# Patient Record
Sex: Male | Born: 1935 | Race: White | Hispanic: No
Health system: Northeastern US, Academic
[De-identification: ages and names within clinical notes are randomized; demographics above are authoritative.]

---

## 2021-05-08 IMAGING — MR MRI LUMBAR SPINE WITHOUT CONTRAST
4 of 7 series · 19 of 48 positions shown · IV contrast (gadolinium)
Comparison: None

FINAL Diagnostic Imaging Report 
________________________________________________________________________________________________ 
MRI LUMBAR SPINE WITHOUT CONTRAST, 05/08/2021 [DATE]: 
CLINICAL INDICATION: SM9.MD LOW BACK PAIN
TECHNIQUE: Multiplanar, multiecho position MR images of the lumbar spine were 
performed without intravenous gadolinium enhancement. Patient was scanned on a 
1.5T magnet.

[Series 2: T2 · coronal · 5.0mm · 0.55mm/px · 5 of 15 slices shown (1 of 3)]
[im 1/15]
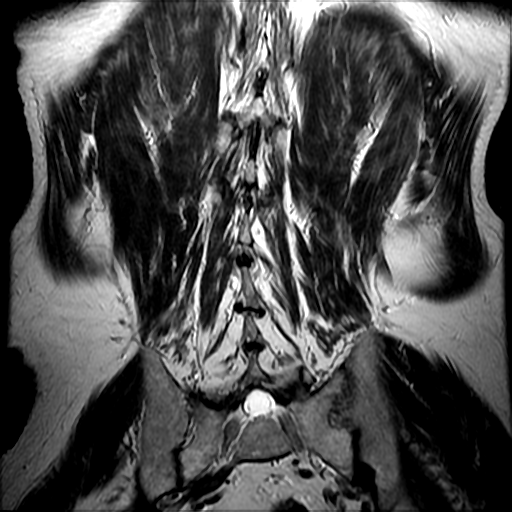
[im 4/15]
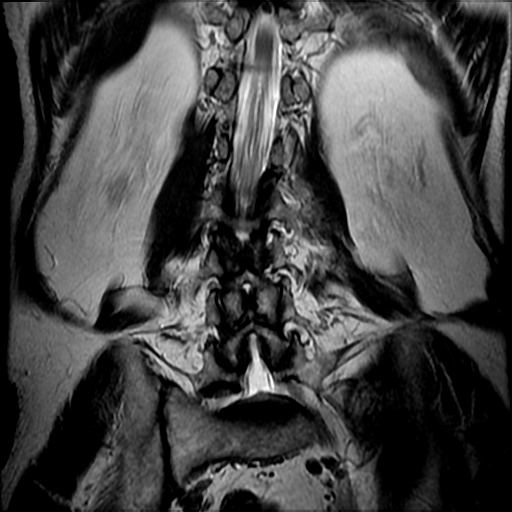
[im 8/15]
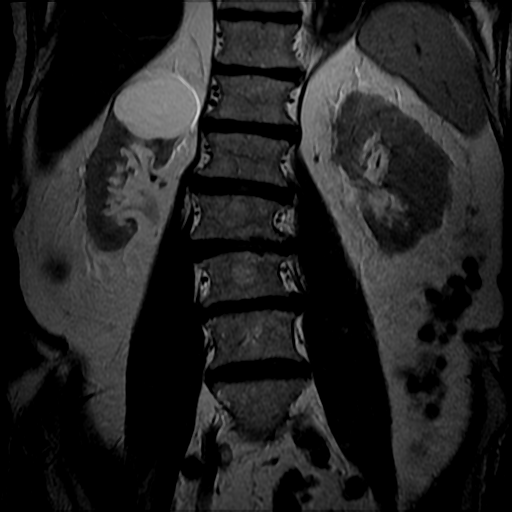
[im 11/15]
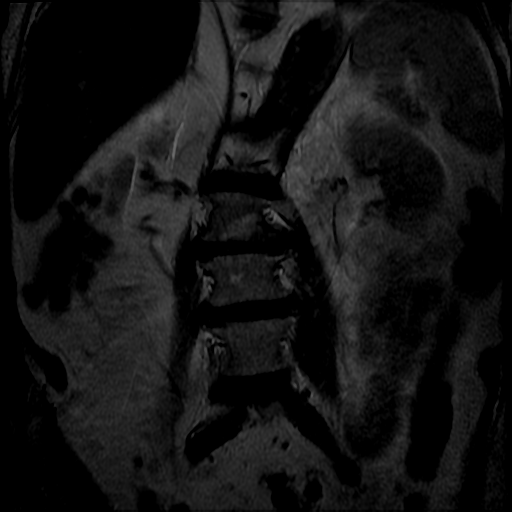
[im 15/15]
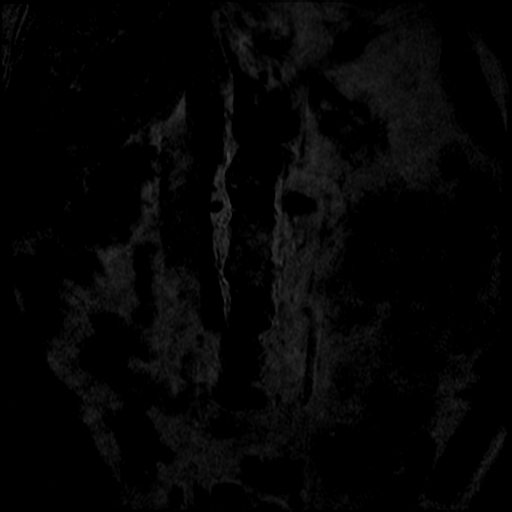

[Series 3: T1 · sagittal · 4.0mm · 0.51mm/px · 3 of 17 slices shown]
[im 4/17]
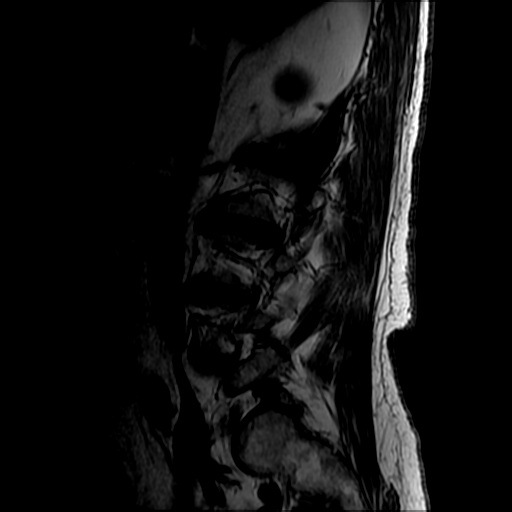
[im 10/17]
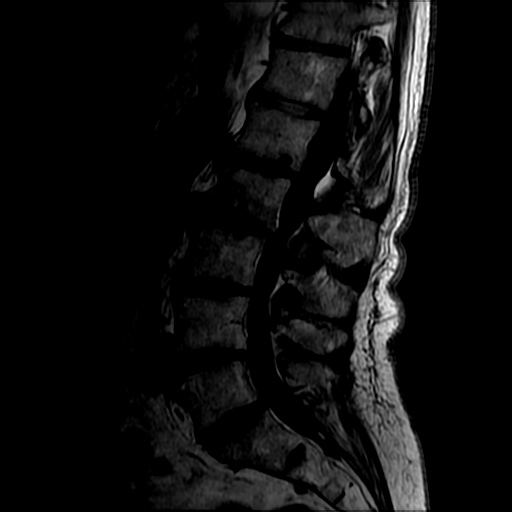
[im 17/17]
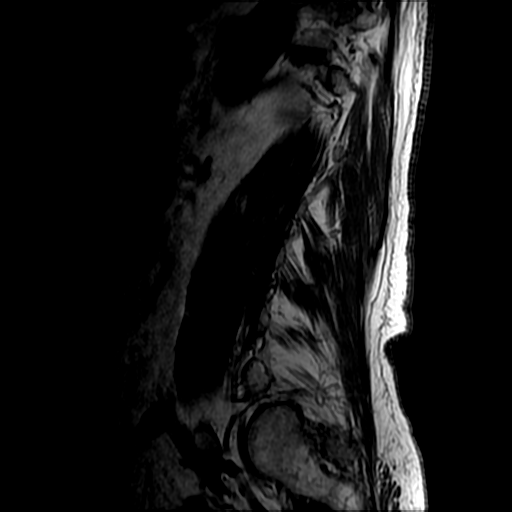

[Series 4: T2 · sagittal · 4.0mm · 0.51mm/px · 6 of 17 slices shown (2 of 3)]
[im 1/17]
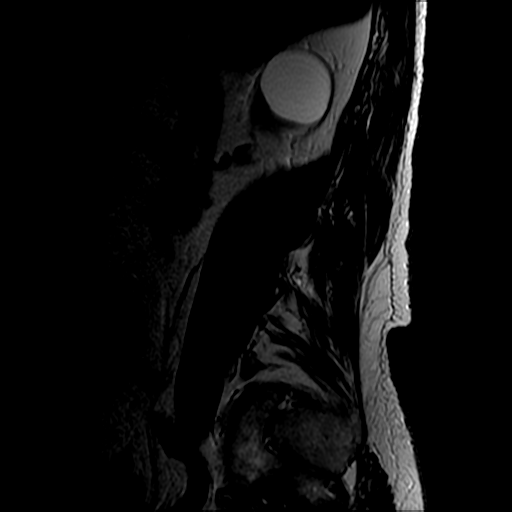
[im 4/17]
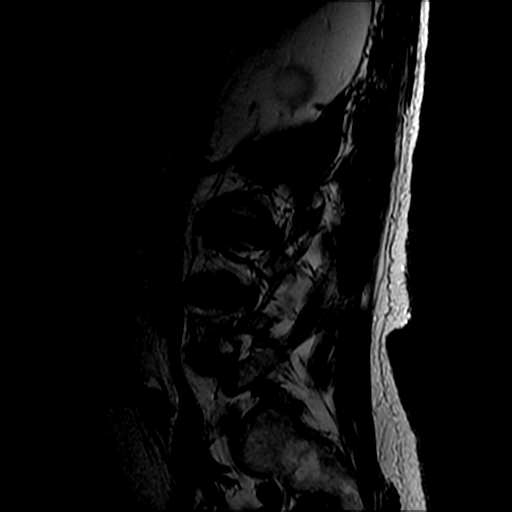
[im 7/17]
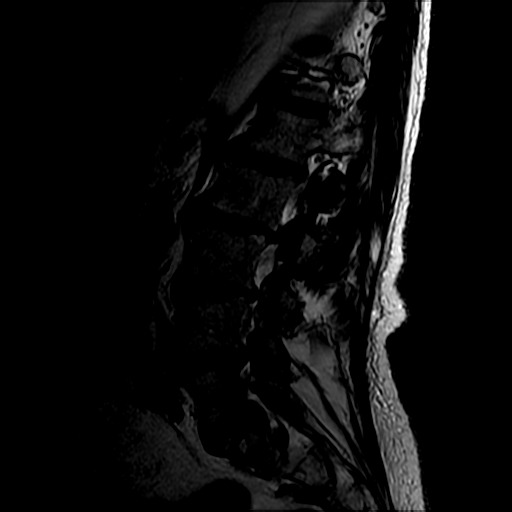
[im 10/17]
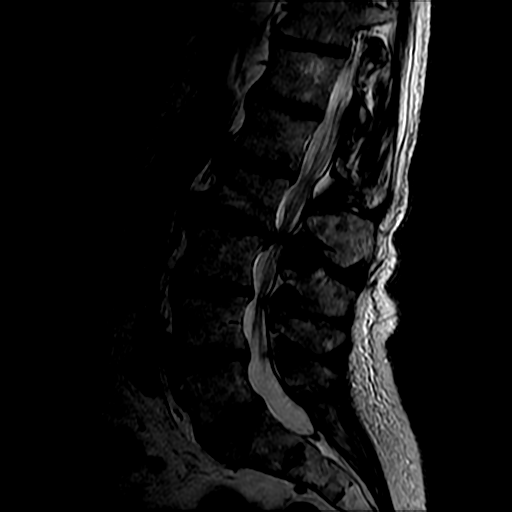
[im 13/17]
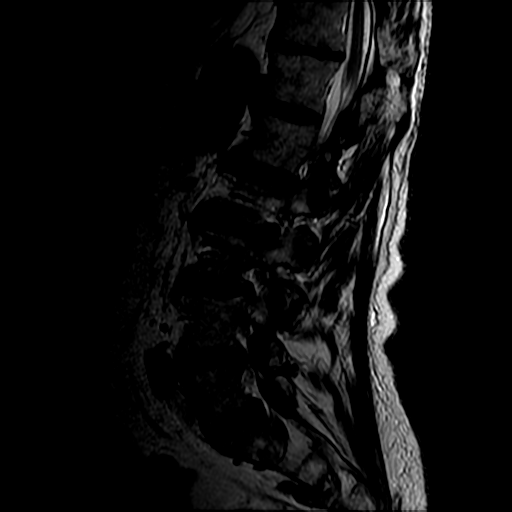
[im 17/17]
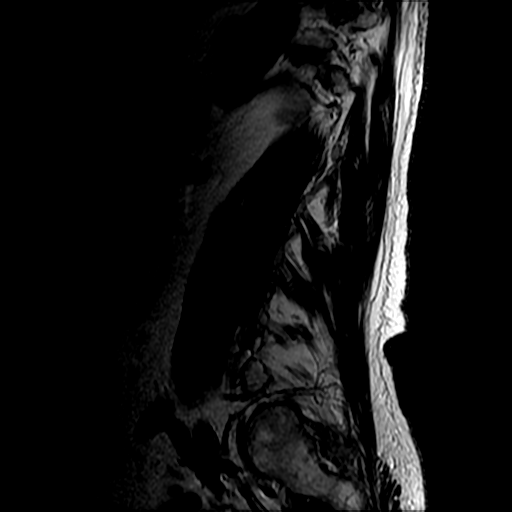

[Series 6: T2 · axial · 4.0mm · 0.35mm/px · z∈[-133,+97]mm · 5 of 30 slices shown (3 of 3)]
[im 1/30]
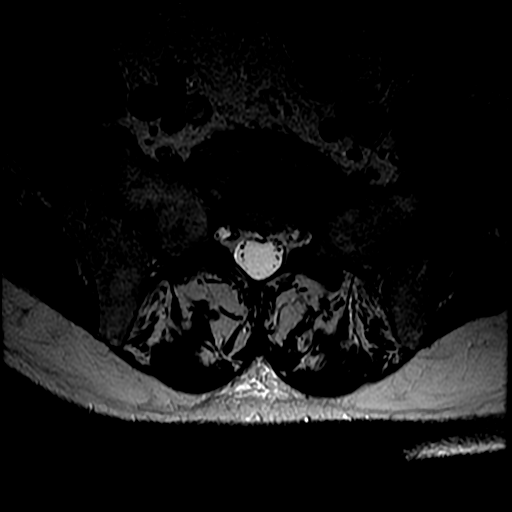
[im 4/30]
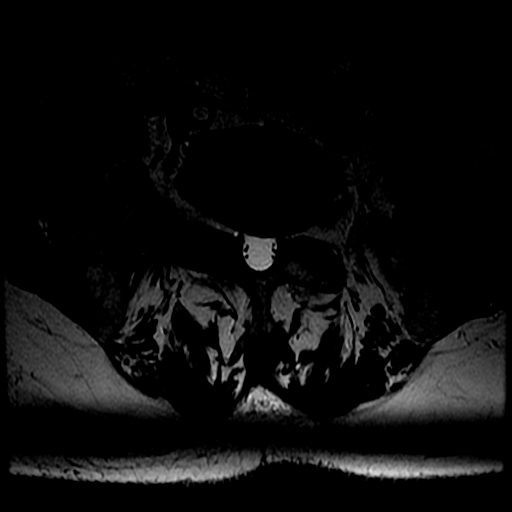
[im 10/30]
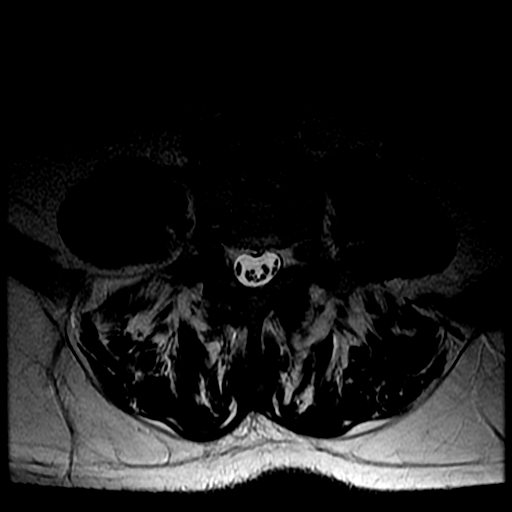
[im 17/30]
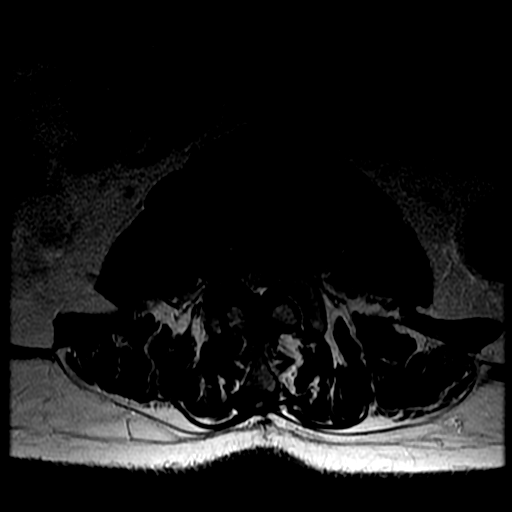
[im 26/30]
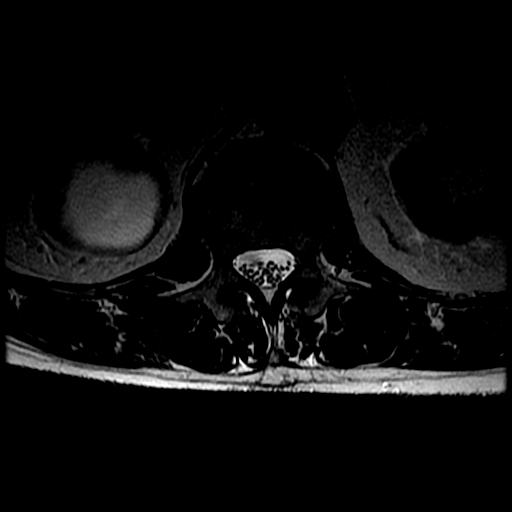

[19 of 48 positions shown; findings below may reference images not displayed]

FINDINGS: Lumbar vertebral heights are intact. There is no compression fracture. 
The lower cord and conus show normal signal, conus terminating at T12-L1. There 
is a small hemangioma at T12. There is a benign cyst in the upper right kidney. 
There is mild upper lumbar dextroscoliosis. There is marked disc narrowing at 
L2-3 to the left of midline. There is moderate narrowing elsewhere, including 
the posterior L3-4 and L4-5 interspaces. There is reactive edema along the left 
L2-3 interspace. 
There is disc-osteophyte on the left at L2-3, contributing to moderate to marked 
canal stenosis. There is encroachment on the exiting L3 nerve roots, worse on 
the left, best seen on axial T2 images 14/15. There is moderate left foraminal 
stenosis. 
At L3-4 and L4-5 there is mild canal stenosis. At L5-S1 the canal is open. At 
L1-2 there is mild canal stenosis. At T12-L1 the canal is open.
IMPRESSION: Multilevel degenerative changes are most pronounced at L2-3. There is moderate 
to marked canal stenosis at this level with left posterolateral disc-osteophyte 
impinging the left L3 nerve root. There is mild canal stenosis at L3-4 and L4-5. 
Reactive edema/Modic type I changes on the left at L2-3. There is mild upper 
lumbar dextroscoliosis. 
No evidence for compression fracture or malignancy.

## 2021-09-15 IMAGING — MR MRI TMJ WITHOUT CONTRAST
9 series · 48 of 48 positions shown · IV contrast (gadolinium)
Comparison: Brain MR September 19, 2010.

________________________________________________________________________________________________ 
MRI TMJ WITHOUT CONTRAST, 09/15/2021 [DATE]: 
CLINICAL INDICATION: 87-year-old male developed pain in the right side of the 
jaw up into the maxillary area after several temporal Crohns place. 
Temporomandibular joint dysfunction syndrome with pain.
TECHNIQUE: Multiplanar, multiecho position MR images of the temporomandibular 
joints were performed without intravenous gadolinium enhancement. Patient was 
scanned on a 3T magnet.

[Series 101: survey · axial · 10.0mm · 0.98mm/px · z∈[+20,+142]mm · 3 of 7 slices shown]
[im 1/7]
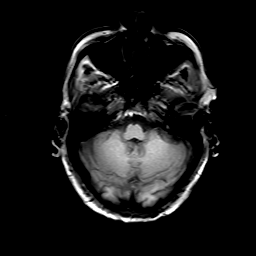
[im 4/7]
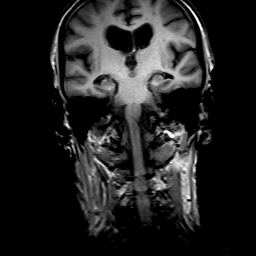
[im 7/7]
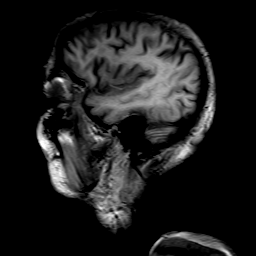

[Series 201: T1 · axial · 4.0mm · 0.70mm/px · z∈[-34,+58]mm · 6 of 22 slices shown]
[im 1/22]
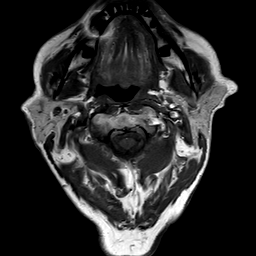
[im 5/22]
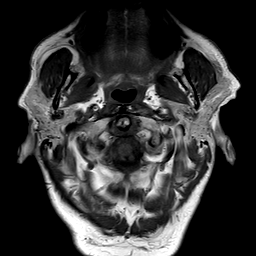
[im 9/22]
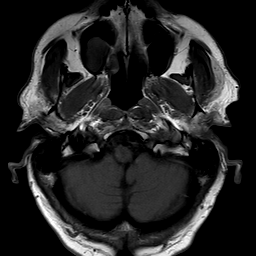
[im 13/22]
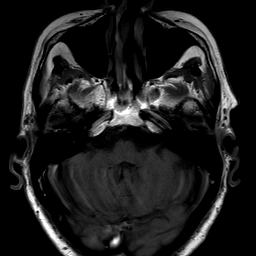
[im 17/22]
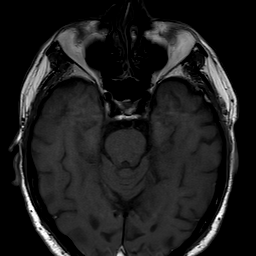
[im 22/22]
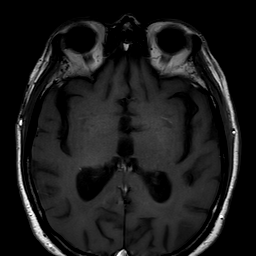

[Series 401: pdw_tse cor · coronal · 3.0mm · 0.59mm/px · 3 of 14 slices shown]
[im 1/14]
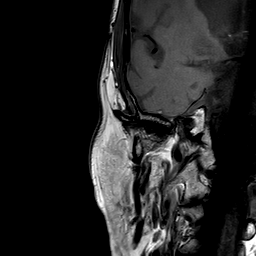
[im 7/14]
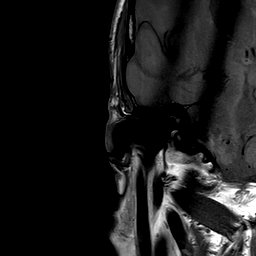
[im 14/14]
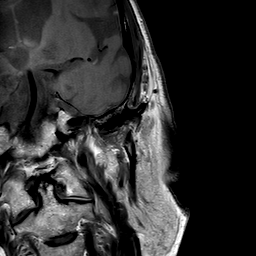

[Series 501: T2 · coronal · 3.0mm · 0.47mm/px · 3 of 14 slices shown]
[im 1/14]
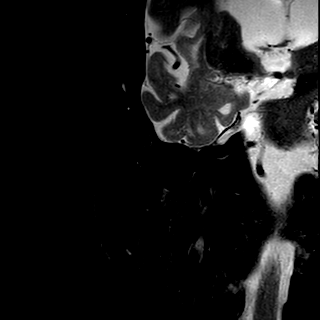
[im 7/14]
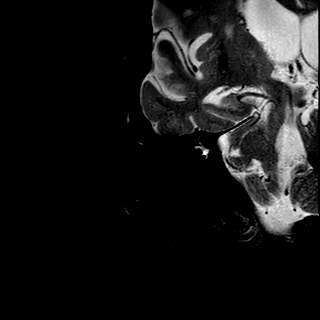
[im 14/14]
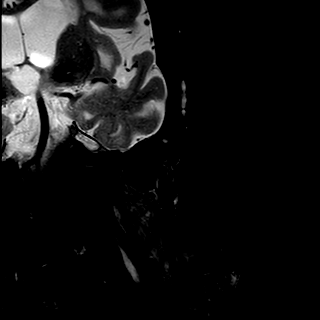

[Series 601: pdsag-closed-bil · sagittal · 2.0mm · 0.52mm/px · 6 of 28 slices shown]
[im 1/28]
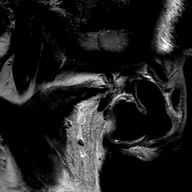
[im 6/28]
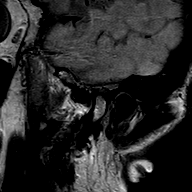
[im 11/28]
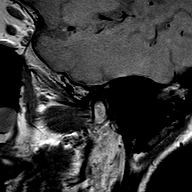
[im 17/28]
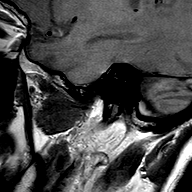
[im 22/28]
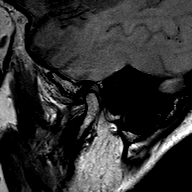
[im 28/28]
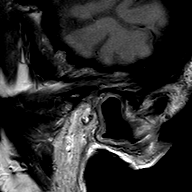

[Series 701: PD · sagittal · 2.0mm · 0.39mm/px · 6 of 28 slices shown]
[im 1/28]
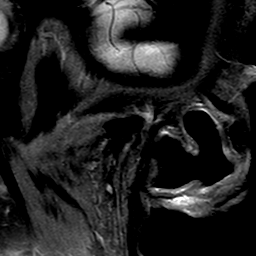
[im 6/28]
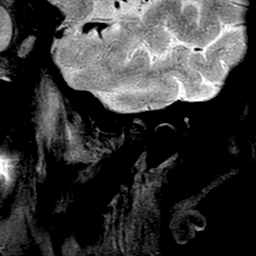
[im 11/28]
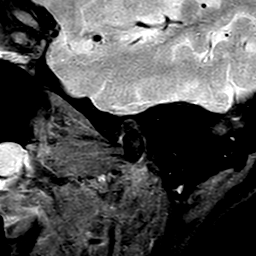
[im 17/28]
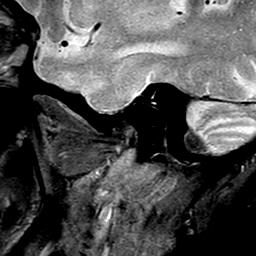
[im 22/28]
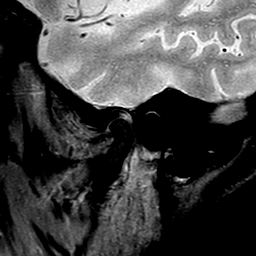
[im 28/28]
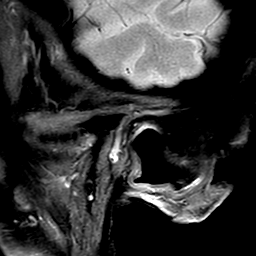

[Series 801: t2w_tse sag · sagittal · 2.0mm · 0.39mm/px · 6 of 28 slices shown]
[im 1/28]
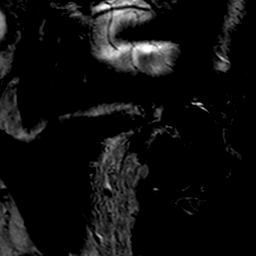
[im 6/28]
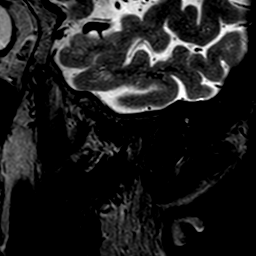
[im 11/28]
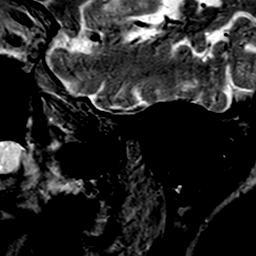
[im 17/28]
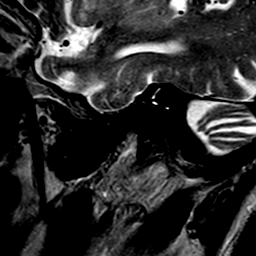
[im 22/28]
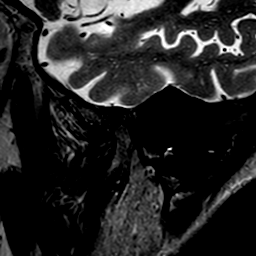
[im 28/28]
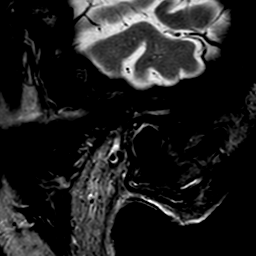

[Series 901: pdsag-open-bil · sagittal · 2.0mm · 0.52mm/px · 6 of 28 slices shown]
[im 1/28]
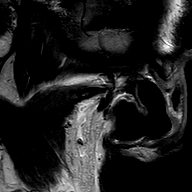
[im 6/28]
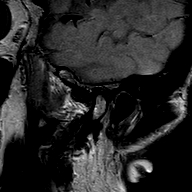
[im 11/28]
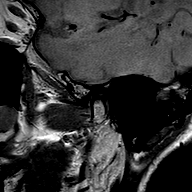
[im 17/28]
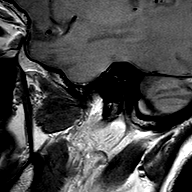
[im 22/28]
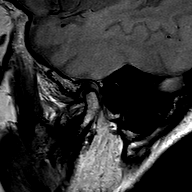
[im 28/28]
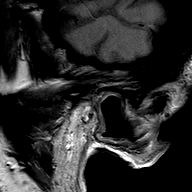

[Series 1001: movie · sagittal · 3.5mm · 0.62mm/px · 9 of 40 slices shown]
[im 1/40]
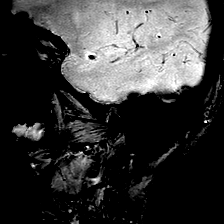
[im 5/40]
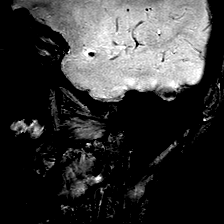
[im 10/40]
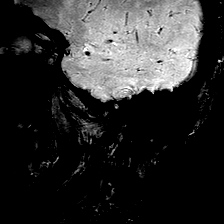
[im 15/40]
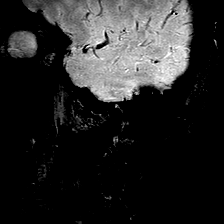
[im 20/40]
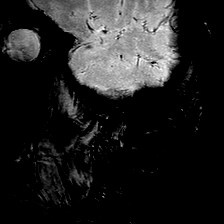
[im 25/40]
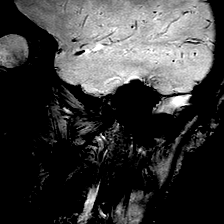
[im 30/40]
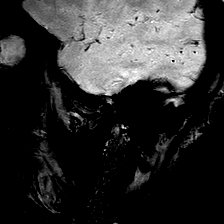
[im 35/40]
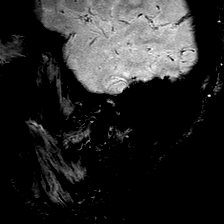
[im 40/40]
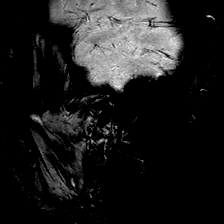

[48 of 48 positions shown; findings below may reference images not displayed]

FINDINGS: The exam is somewhat limited due to the patients relative inability to open his 
mouth. 
Surveillance views demonstrate a moderate-sized inferior right maxillary mucous 
retention cyst. Other sinuses are well pneumatized. Nasal septum deviates to the 
right but appears intact. Visualized intracranial contents are unremarkable. 
Bilateral pseudophakia. Other sinuses are well pneumatized. There is scattered 
minimal fluid within several right mastoid air cells. 
 Right temporomandibular joint: No evidence of a joint effusion. No significant 
degenerative change involving the right mandibular condylar head. The articular 
disc is appropriately located there is no evidence of disc dislocation. There is 
minimal translation of the mandibular condylar head in the open-mouth position. 
No evidence of disc displacement. Given the relative lack of translation of the 
mandibular condylar head in the open mouth position, a stuck disc would be 
excluded. 
Left temporomandibular joint: No significant degenerative changes involving the 
left mandibular condylar head. No joint effusion. No evidence of disc 
displacement. There is very little translation of the condylar head in the 
open-mouth position. Given the relative lack of translation of the mandibular 
condylar head in the open-mouth position, a stuck disc would be difficult to 
exclude.
IMPRESSION: No evidence of disc displacement. No joint effusion. Very little translation of 
the condylar head in the open-mouth position, and therefore a stuck disc 
bilaterally would be difficult to exclude.  
Right maxillary mucous retention cyst.

## 2022-08-21 ENCOUNTER — Inpatient Hospital Stay: Admit: 2022-08-21 | Discharge: 2022-08-28 | Payer: MEDICARE | Attending: Vascular Surgery | Admitting: Vascular Surgery

## 2022-08-21 ENCOUNTER — Emergency Department: Admit: 2022-08-21 | Payer: MEDICARE | Attending: Diagnostic Radiology

## 2022-08-21 ENCOUNTER — Emergency Department: Admit: 2022-08-21 | Payer: MEDICARE

## 2022-08-21 ENCOUNTER — Encounter: Admit: 2022-08-21 | Payer: PRIVATE HEALTH INSURANCE

## 2022-08-21 ENCOUNTER — Inpatient Hospital Stay: Admit: 2022-08-21 | Discharge: 2022-08-21 | Payer: MEDICARE | Attending: Emergency Medicine

## 2022-08-21 DIAGNOSIS — R059 Cough, unspecified: Secondary | ICD-10-CM

## 2022-08-21 DIAGNOSIS — R197 Diarrhea, unspecified: Secondary | ICD-10-CM

## 2022-08-21 DIAGNOSIS — Z79899 Other long term (current) drug therapy: Secondary | ICD-10-CM

## 2022-08-21 DIAGNOSIS — Z7982 Long term (current) use of aspirin: Secondary | ICD-10-CM

## 2022-08-21 DIAGNOSIS — R109 Unspecified abdominal pain: Secondary | ICD-10-CM

## 2022-08-21 LAB — COMPREHENSIVE METABOLIC PANEL
BKR A/G RATIO: 1.2 (ref 1.0–2.2)
BKR ALANINE AMINOTRANSFERASE (ALT): 22 U/L (ref 14–63)
BKR ALBUMIN: 4.2 g/dL (ref 3.4–5.0)
BKR ALKALINE PHOSPHATASE: 81 U/L (ref 46–116)
BKR ANION GAP: 10 (ref 5–16)
BKR ASPARTATE AMINOTRANSFERASE (AST): 21 U/L (ref 10–42)
BKR AST/ALT RATIO: 1
BKR BILIRUBIN TOTAL: 0.5 mg/dL (ref 0.2–1.2)
BKR BLOOD UREA NITROGEN: 27 mg/dL — ABNORMAL HIGH (ref 7–18)
BKR BUN / CREAT RATIO: 14.2 (ref 8.0–23.0)
BKR CALCIUM: 9.5 mg/dL (ref 8.5–10.5)
BKR CHLORIDE: 104 mmol/L (ref 98–107)
BKR CO2: 26 mmol/L (ref 21–32)
BKR CREATININE: 1.9 mg/dL — ABNORMAL HIGH (ref 0.80–1.30)
BKR EGFR, CREATININE (CKD-EPI 2021): 34 mL/min/{1.73_m2} — ABNORMAL LOW (ref >=60)
BKR GLOBULIN: 3.4 g/dL (ref 2.3–3.5)
BKR GLUCOSE: 83 mg/dL (ref 70–100)
BKR POTASSIUM: 4.4 mmol/L (ref 3.5–5.1)
BKR PROTEIN TOTAL: 7.6 g/dL (ref 6.0–8.0)
BKR SODIUM: 140 mmol/L (ref 136–145)

## 2022-08-21 LAB — BASIC METABOLIC PANEL
BKR ANION GAP: 11 (ref 7–17)
BKR BLOOD UREA NITROGEN: 21 mg/dL (ref 8–23)
BKR BUN / CREAT RATIO: 12.5 (ref 8.0–23.0)
BKR CALCIUM: 8.7 mg/dL — ABNORMAL LOW (ref 8.8–10.2)
BKR CHLORIDE: 111 mmol/L — ABNORMAL HIGH (ref 98–107)
BKR CO2: 22 mmol/L (ref 20–30)
BKR CREATININE: 1.68 mg/dL — ABNORMAL HIGH (ref 0.40–1.30)
BKR EGFR, CREATININE (CKD-EPI 2021): 39 mL/min/{1.73_m2} — ABNORMAL LOW (ref >=60)
BKR GLUCOSE: 82 mg/dL (ref 70–100)
BKR POTASSIUM: 4.2 mmol/L (ref 3.3–5.3)
BKR SODIUM: 144 mmol/L (ref 136–144)
BKR WAM HEMOGLOBIN: 11 g/dL — ABNORMAL LOW (ref 7–17)

## 2022-08-21 LAB — CBC WITH AUTO DIFFERENTIAL
BKR WAM ABSOLUTE IMMATURE GRANULOCYTES.: 0.05 x 1000/ÂµL (ref 0.00–0.30)
BKR WAM ABSOLUTE IMMATURE GRANULOCYTES.: 0.03 x 1000/ÂµL (ref 0.00–0.30)
BKR WAM ABSOLUTE LYMPHOCYTE COUNT.: 1.65 x 1000/ÂµL (ref 0.60–3.70)
BKR WAM ABSOLUTE LYMPHOCYTE COUNT.: 1.78 x 1000/??L (ref 0.60–3.70)
BKR WAM ABSOLUTE NRBC (2 DEC): 0 x 1000/ÂµL (ref 0.00–1.00)
BKR WAM ABSOLUTE NRBC (2 DEC): 0 x 1000/??L (ref 0.00–1.00)
BKR WAM ANALYZER ANC: 4.82 x 1000/ÂµL (ref 2.00–7.60)
BKR WAM ANALYZER ANC: 4.14 x 1000/ÂµL (ref 2.00–7.60)
BKR WAM BASOPHIL ABSOLUTE COUNT.: 0.08 x 1000/ÂµL (ref 0.00–1.00)
BKR WAM BASOPHIL ABSOLUTE COUNT.: 0.07 x 1000/ÂµL (ref 0.00–1.00)
BKR WAM BASOPHILS: 1.1 % (ref 0.0–1.4)
BKR WAM BASOPHILS: 1 % (ref 0.0–1.4)
BKR WAM EOSINOPHIL ABSOLUTE COUNT.: 0.24 x 1000/ÂµL (ref 0.00–1.00)
BKR WAM EOSINOPHIL ABSOLUTE COUNT.: 0.23 x 1000/ÂµL (ref 0.00–1.00)
BKR WAM EOSINOPHILS: 3.2 % (ref 0.0–5.0)
BKR WAM EOSINOPHILS: 3.4 % (ref 0.0–5.0)
BKR WAM HEMATOCRIT (2 DEC): 46 % (ref 38.50–50.00)
BKR WAM HEMATOCRIT (2 DEC): 37.5 % — ABNORMAL LOW (ref 38.50–50.00)
BKR WAM HEMOGLOBIN: 15.1 g/dL (ref 13.2–17.1)
BKR WAM IMMATURE GRANULOCYTES: 0.7 % (ref 0.0–1.0)
BKR WAM IMMATURE GRANULOCYTES: 0.4 % (ref 0.0–1.0)
BKR WAM LYMPHOCYTES: 22 % (ref 17.0–50.0)
BKR WAM LYMPHOCYTES: 26.3 % (ref 17.0–50.0)
BKR WAM MCH (PG): 34.1 pg — ABNORMAL HIGH (ref 27.0–33.0)
BKR WAM MCH (PG): 34.3 pg — ABNORMAL HIGH (ref 27.0–33.0)
BKR WAM MCHC: 32.8 g/dL (ref 31.0–36.0)
BKR WAM MCHC: 32.8 g/dL (ref 31.0–36.0)
BKR WAM MCV: 103.8 fL — ABNORMAL HIGH (ref 80.0–100.0)
BKR WAM MCV: 104.5 fL — ABNORMAL HIGH (ref 80.0–100.0)
BKR WAM MONOCYTE ABSOLUTE COUNT.: 0.65 x 1000/ÂµL (ref 0.00–1.00)
BKR WAM MONOCYTE ABSOLUTE COUNT.: 0.52 x 1000/ÂµL (ref 0.00–1.00)
BKR WAM MONOCYTES: 8.7 % (ref 4.0–12.0)
BKR WAM MONOCYTES: 7.7 % (ref 4.0–12.0)
BKR WAM MPV: 9.9 fL (ref 8.0–12.0)
BKR WAM MPV: 9.9 fL (ref 8.0–12.0)
BKR WAM NEUTROPHILS: 64.3 % (ref 39.0–72.0)
BKR WAM NEUTROPHILS: 61.2 % (ref 39.0–72.0)
BKR WAM NUCLEATED RED BLOOD CELLS: 0 % (ref 0.0–1.0)
BKR WAM NUCLEATED RED BLOOD CELLS: 0 % (ref 0.0–1.0)
BKR WAM PLATELETS: 234 x1000/ÂµL (ref 150–420)
BKR WAM PLATELETS: 186 x1000/ÂµL (ref 150–420)
BKR WAM RDW-CV: 13.5 % (ref 11.0–15.0)
BKR WAM RDW-CV: 13.5 % (ref 11.0–15.0)
BKR WAM RED BLOOD CELL COUNT.: 4.43 M/ÂµL (ref 4.00–6.00)
BKR WAM RED BLOOD CELL COUNT.: 3.59 M/ÂµL — ABNORMAL LOW (ref 4.00–6.00)
BKR WAM WHITE BLOOD CELL COUNT: 7.5 x1000/ÂµL (ref 4.0–11.0)
BKR WAM WHITE BLOOD CELL COUNT: 6.8 x1000/ÂµL (ref 4.0–11.0)

## 2022-08-21 LAB — PROTIME AND INR
BKR INR: 1.02 (ref 0.93–1.07)
BKR PROTHROMBIN TIME: 10.3 s (ref 9.5–12.1)

## 2022-08-21 MED ORDER — VANCOMYCIN 1 G IN 250 ML IVPB (VIALMATE)
Freq: Two times a day (BID) | INTRAVENOUS | Status: DC
Start: 2022-08-21 — End: 2022-08-21

## 2022-08-21 MED ORDER — SODIUM CHLORIDE 0.9 % BOLUS (NEW BAG)
0.9 % | Freq: Once | INTRAVENOUS | Status: CP
Start: 2022-08-21 — End: ?
  Administered 2022-08-21: 14:00:00 0.9 mL/h via INTRAVENOUS

## 2022-08-21 MED ORDER — PIPERACILLIN-TAZOBACTAM (ZOSYN) 3.375GM MBP
Freq: Four times a day (QID) | INTRAVENOUS | Status: CP
Start: 2022-08-21 — End: ?
  Administered 2022-08-22 (×3): 50.000 mL/h via INTRAVENOUS

## 2022-08-21 MED ORDER — OXYCODONE IMMEDIATE RELEASE 5 MG TABLET
5 mg | ORAL | Status: DC | PRN
Start: 2022-08-21 — End: 2022-08-29

## 2022-08-21 MED ORDER — SODIUM CHLORIDE 0.9 % (FLUSH) INJECTION SYRINGE
0.9 % | INTRAVENOUS | Status: DC | PRN
Start: 2022-08-21 — End: 2022-08-29
  Administered 2022-08-28: 01:00:00 0.9 mL via INTRAVENOUS

## 2022-08-21 MED ORDER — LABETALOL 5 MG/ML INTRAVENOUS SOLUTION
5 mg/mL | INTRAVENOUS | Status: DC | PRN
Start: 2022-08-21 — End: 2022-08-22

## 2022-08-21 MED ORDER — VANCOMYCIN 1 G IN 250 ML IVPB (VIALMATE)
Freq: Once | INTRAVENOUS | Status: CP
Start: 2022-08-21 — End: ?
  Administered 2022-08-22: 02:00:00 250.000 mL/h via INTRAVENOUS

## 2022-08-21 MED ORDER — PIPERACILLIN-TAZOBACTAM (ZOSYN) 4.5GM MBP - ED FIRST DOSE
Freq: Once | INTRAVENOUS | Status: DC
Start: 2022-08-21 — End: 2022-08-21

## 2022-08-21 MED ORDER — METOPROLOL TARTRATE (LOPRESSOR) IMMEDIATE RELEASE 12.5 MG HALFTAB
12.5 mg | Freq: Two times a day (BID) | ORAL | Status: DC
Start: 2022-08-21 — End: 2022-08-29
  Administered 2022-08-22 – 2022-08-28 (×13): 12.5 mg via ORAL

## 2022-08-21 MED ORDER — PIPERACILLIN-TAZOBACTAM (ZOSYN) 4.5GM MBP
Freq: Four times a day (QID) | INTRAVENOUS | Status: DC
Start: 2022-08-21 — End: 2022-08-21

## 2022-08-21 MED ORDER — IOHEXOL 350 MG IODINE/ML INTRAVENOUS SOLUTION
350 mg iodine/mL | Freq: Once | INTRAVENOUS | Status: CP | PRN
Start: 2022-08-21 — End: ?
  Administered 2022-08-21: 15:00:00 350 mL via INTRAVENOUS

## 2022-08-21 MED ORDER — LABETALOL 5 MG/ML INTRAVENOUS SOLUTION
5 mg/mL | Freq: Once | INTRAVENOUS | Status: DC
Start: 2022-08-21 — End: 2022-08-22

## 2022-08-21 MED ORDER — ACETAMINOPHEN 325 MG TABLET
325 mg | Freq: Four times a day (QID) | ORAL | Status: DC | PRN
Start: 2022-08-21 — End: 2022-08-29
  Administered 2022-08-22 – 2022-08-23 (×2): 325 mg via ORAL

## 2022-08-21 MED ORDER — SODIUM CHLORIDE 0.9 % (FLUSH) INJECTION SYRINGE
0.9 % | Freq: Three times a day (TID) | INTRAVENOUS | Status: DC
Start: 2022-08-21 — End: 2022-08-29
  Administered 2022-08-22 – 2022-08-24 (×8): 0.9 mL via INTRAVENOUS

## 2022-08-21 MED ORDER — HEPARIN (PORCINE) 5,000 UNIT/ML INJECTION SOLUTION
5000 unit/mL | Freq: Three times a day (TID) | SUBCUTANEOUS | Status: DC
Start: 2022-08-21 — End: 2022-08-22

## 2022-08-21 MED ORDER — PIPERACILLIN-TAZOBACTAM (ZOSYN) 3.375GM MBP - ED FIRST DOSE
Freq: Once | INTRAVENOUS | Status: CP
Start: 2022-08-21 — End: ?
  Administered 2022-08-22: 01:00:00 50.000 mL/h via INTRAVENOUS

## 2022-08-21 MED ORDER — ACETAMINOPHEN 325 MG TABLET
325 mg | Freq: Four times a day (QID) | ORAL | Status: DC
Start: 2022-08-21 — End: 2022-08-22

## 2022-08-21 NOTE — ED Notes
1:59 PM BIBA as transfer from [facility] for leaking aorta unknown amount of time. Pt noted to be on plavix. Pt noted to have had diarrhea x 3 weeks with dark red blood in the stool and endorses abdominal pain only with BM. Pt reports Hx of AAA procedure. Pt denies CP, SOB, N/V/D, fevers, HA and dizziness. Pt connected to continuous cardiac, SPO2, and BP, monitoring. Neuro intact, speech clear, steady gait, A/Ox4. No further complaints. Past Medical History: Diagnosis Date  CKD (chronic kidney disease) August 2012  Stage 3  Coronary artery disease   Diverticulosis   H/O gastroesophageal reflux (GERD)   no longer requires medication  History of CEA (carotid endarterectomy) February 2006  L side  HLD (hyperlipidemia)   HTN (hypertension)   S/P AAA (abdominal aortic aneurysm) repair Repair Nov 2008  Sciatica   TIA (transient ischemic attack) November 2008  possible 4:19 PM pt resting in bed, daughter at bedside. 4:35 PM pt to BR with steady gait and staff assistance. 6:18 PM blood work drawn and sent to lab, awaiting results. 6:30 PM pt complaining of CP, EKG placed awaiting results. 7:12 PM report given to oncoming RN, care transferred.

## 2022-08-21 NOTE — ED Notes
1120 Pt visiting from [place] and reports frequent diarrhea for 4 weeks. Denies any nausea or vomiting. Reports blood in stool. report from [facility]. Family remains at bedside. 1206 ready for re-eval. No rectal exam 1210 decision is made for transfer out to [facility] vitals [id number] report is called and waiting for transfer to [facility].1320 Report to EMS and pt is transferred to [facility].  BUN 27 mg/dL[Ref Range: 7 - 45][id number] 1.90 mg/dL[Ref Range: 8.29 - 5.62]ZHY IMPRESSION: No acute pulmonary abnormality.Large hiatal hernia.Johnson IMPRESSION:1. There is soft tissue thickening around the aortic wall and extending into the right common iliac artery at the level of the iliac bifurcation, concerning for hematoma.2. There is fat stranding of the dome of the bladder. Correlate clinically for cystitis.3. Large hiatal hernia.4. Extensive diverticulosis but no acute diverticulitis or bowel obstruction.

## 2022-08-21 NOTE — Utilization Review (ED)
UM Status: UR/MD agree on IP status, Dr. Tacey Ruiz ColucciLeaking abdominal aortic aneurysm. Transfer from OSH for [place] imaging findings were concerning for an aortic hematoma in the setting of 3 weeks of intermittent melanotic diarrhea and increasing fatigue. Vascular surgery SDU admit. IV abx.

## 2022-08-21 NOTE — ED Provider Notes
Chief Complaint Patient presents with ? Diarrhea   Pt visiting from [place] and reports frequent diarrhea for 4 weeks. Denies any nausea or vomiting. Reports blood in stool.  HPI/PE:Patient is a 88 year old male visiting from [place] who presents with loose stools with dark blood in them.  Patient states he had the same episode in [place] few weeks ago but then it got better.  Has traveled up here and then diarrhea has recurred.  No fevers or chills no nausea vomiting no abdominal pain.  Patient is on Plavix and aspirin.  Patient also states that 6 weeks of a cough with phlegm.  History of diverticulosis.  Last colonoscopy was 8 years ago.  Physical ExamED Triage Vitals [08/21/22 0957]BP: (!) 164/88Pulse: 70Pulse from  O2 sat: 72Resp: 16Temp: 97.6 ?F (36.4 ?C)Temp src: TemporalSpO2: 100 % BP (!) 148/74  - Pulse 61  - Temp 98 ?F (36.7 ?C) (Temporal)  - Resp 16  - Ht 5' 5 (1.651 m)  - Wt 67.1 kg (148 lb)  - SpO2 96%  - BMI 24.63 kg/m? Physical ExamVitals and nursing note reviewed. Constitutional:     General: He is not in acute distress.   Appearance: Normal appearance. He is normal weight. He is not ill-appearing. HENT:    Head: Normocephalic and atraumatic.    Right Ear: External ear normal.    Left Ear: External ear normal.    Nose: Nose normal.    Mouth/Throat:    Mouth: Mucous membranes are moist.    Pharynx: Oropharynx is clear. No oropharyngeal exudate. Eyes:    Extraocular Movements: Extraocular movements intact.    Conjunctiva/sclera: Conjunctivae normal.    Pupils: Pupils are equal, round, and reactive to light. Cardiovascular:    Rate and Rhythm: Normal rate and regular rhythm.    Pulses: Normal pulses. Pulmonary:    Effort: Pulmonary effort is normal. No respiratory distress.    Breath sounds: Normal breath sounds. No wheezing. Abdominal:    General: Abdomen is flat. There is no distension.    Palpations: Abdomen is soft.    Tenderness: There is no abdominal tenderness. Musculoskeletal:       General: No swelling or tenderness. Normal range of motion.    Cervical back: Normal range of motion and neck supple. Skin:   General: Skin is warm and dry.    Capillary Refill: Capillary refill takes less than 2 seconds.    Coloration: Skin is not jaundiced. Neurological:    General: No focal deficit present.    Mental Status: He is alert and oriented to person, place, and time. Psychiatric:       Mood and Affect: Mood normal.  ProceduresAttestation/Critical CarePatient Reevaluation: 88 year old male with bloody diarrhea.  We will check labs, we will check [place] scan rule out colitis.  IV fluids.  We will check x-ray given the cough as well.Comments as of 08/21/22 1306 Fri Aug 21, 2022 1156 [place] noted.  D/w family.  Calling vascular here.  Await callback. [SS] 1206 D/w Vasc surgeon @ [facility] - accepted in transfer.  Will see in ED.  Dr. Loletta Parish.  [SS]  Comments User Index[SS] Cristy Folks, MD   Clinical Impressions as of 08/21/22 1306 Abdominal pain, unspecified abdominal location  ED DispositionTransfer To Another Facility Cristy Folks, MD09/22/23 1306 Cristy Folks, MD09/22/23 1306

## 2022-08-21 NOTE — ED Notes
1002AM: Pt is a 88 year old male presenting to the ED for diarrhea x3 weeks. Pt also noted to have blood in his stool. Of note, pt is visiting from [place]. Pt denies any dizziness. Denies any current active bleeding. Pt denies any nausea or vomiting. Denies any fever or chills. Reports no genitourinary discomfort. Pt not in distress. Resting on the stretcher. Awaiting to be seen by provider. 1005AM:MD present at bedside assessing pt.

## 2022-08-22 LAB — CBC WITH AUTO DIFFERENTIAL
BKR WAM ABSOLUTE IMMATURE GRANULOCYTES.: 0.05 x 1000/ÂµL (ref 0.00–0.30)
BKR WAM ABSOLUTE LYMPHOCYTE COUNT.: 1.86 x 1000/ÂµL (ref 0.60–3.70)
BKR WAM ABSOLUTE NRBC (2 DEC): 0 x 1000/ÂµL (ref 0.00–1.00)
BKR WAM ANALYZER ANC: 4.14 x 1000/ÂµL (ref 2.00–7.60)
BKR WAM BASOPHIL ABSOLUTE COUNT.: 0.08 x 1000/ÂµL (ref 0.00–1.00)
BKR WAM BASOPHILS: 1.1 % (ref 0.0–1.4)
BKR WAM EOSINOPHIL ABSOLUTE COUNT.: 0.32 x 1000/ÂµL (ref 0.00–1.00)
BKR WAM EOSINOPHILS: 4.5 % (ref 0.0–5.0)
BKR WAM HEMATOCRIT (2 DEC): 40.4 % (ref 38.50–50.00)
BKR WAM HEMOGLOBIN: 13.5 g/dL (ref 13.2–17.1)
BKR WAM IMMATURE GRANULOCYTES: 0.7 % (ref 0.0–1.0)
BKR WAM LYMPHOCYTES: 26.1 % (ref 17.0–50.0)
BKR WAM MCH (PG): 34.4 pg — ABNORMAL HIGH (ref 27.0–33.0)
BKR WAM MCHC: 33.4 g/dL (ref 31.0–36.0)
BKR WAM MCV: 103.1 fL — ABNORMAL HIGH (ref 80.0–100.0)
BKR WAM MONOCYTE ABSOLUTE COUNT.: 0.68 x 1000/ÂµL (ref 0.00–1.00)
BKR WAM MONOCYTES: 9.5 % (ref 4.0–12.0)
BKR WAM MPV: 9.9 fL (ref 8.0–12.0)
BKR WAM NEUTROPHILS: 58.1 % (ref 39.0–72.0)
BKR WAM NUCLEATED RED BLOOD CELLS: 0 % (ref 0.0–1.0)
BKR WAM PLATELETS: 206 x1000/ÂµL (ref 150–420)
BKR WAM RDW-CV: 13.4 % (ref 11.0–15.0)
BKR WAM RED BLOOD CELL COUNT.: 3.92 M/ÂµL — ABNORMAL LOW (ref 4.00–6.00)
BKR WAM WHITE BLOOD CELL COUNT: 7.1 x1000/ÂµL (ref 4.0–11.0)

## 2022-08-22 LAB — BASIC METABOLIC PANEL
BKR ANION GAP: 10 (ref 7–17)
BKR BLOOD UREA NITROGEN: 21 mg/dL (ref 8–23)
BKR BUN / CREAT RATIO: 12.1 (ref 8.0–23.0)
BKR CALCIUM: 8.8 mg/dL (ref 8.8–10.2)
BKR CHLORIDE: 110 mmol/L — ABNORMAL HIGH (ref 98–107)
BKR CO2: 20 mmol/L (ref 20–30)
BKR CREATININE: 1.74 mg/dL — ABNORMAL HIGH (ref 0.40–1.30)
BKR EGFR, CREATININE (CKD-EPI 2021): 38 mL/min/{1.73_m2} — ABNORMAL LOW (ref >=60)
BKR GLUCOSE: 82 mg/dL (ref 70–100)
BKR POTASSIUM: 4.3 mmol/L (ref 3.3–5.3)
BKR SODIUM: 140 mmol/L (ref 136–144)

## 2022-08-22 LAB — PHOSPHORUS     (BH GH L LMW YH): BKR PHOSPHORUS: 3.1 mg/dL (ref 2.2–4.5)

## 2022-08-22 LAB — MAGNESIUM: BKR MAGNESIUM: 2.4 mg/dL (ref 1.7–2.4)

## 2022-08-22 LAB — ZZZMRSA BY PCR- VANCO RX (PHARMACY USE ONLY) (YH): BKR MRSA COLONIZATION STATUS PCR: NOT DETECTED

## 2022-08-22 MED ORDER — VANCOMYCIN 1 G IN 250 ML IVPB (VIALMATE)
INTRAVENOUS | Status: DC
Start: 2022-08-22 — End: 2022-08-25
  Administered 2022-08-24: 02:00:00 250.000 mL/h via INTRAVENOUS

## 2022-08-22 MED ORDER — VANCOMYCIN 1 G IN 250 ML IVPB (VIALMATE)
INTRAVENOUS | Status: DC
Start: 2022-08-22 — End: 2022-08-22

## 2022-08-22 NOTE — ED Provider Notes
Chief Complaint Patient presents with ? Abdominal Pain   Pt here to see vascular from [place] for aortic leak found on [place] RESIDENT SUMMARY, ASSESSMENT AND PLANHPI:  Cameron Zimmerman is a 88 y.o. male with history of CAD, CKD remote GI bleed presenting upon transfer from [facility] after [place] imaging findings were concerning for an aortic hematoma in the setting of 3 weeks of intermittent melanotic diarrhea and increasing fatigue. Has mild crampy abdominal pain, waxes and wanes, is a 1/10 right now. No lightheadedness/dizziness, SOB, chest pain, syncope or pre-syncope. Tolerating PO as normal. No urinary symptoms. Pertinent Initial Exam Findings:BP (!) 154/85  - Pulse 70  - Temp (!) 96.9 ?F (36.1 ?C) (Oral)  - Resp 17  - SpO2 99% Appears well, no acute distress. Easy respiratory effort on room air, lungs CTAB. Heart RRR. Abdomen soft, non-tender to light palpation, non-distended. Extremities warm and well perfused, pulses 2+ with chronic appearing venous stasis/peripheral artery disease changes. Chronic wound to left lower leg.MDM/A&P:Patient presents from [facility] via [facility] transfer for vascular surgery consult for concering aorta findings on [place] in the setting of weeks of abdominal discomfort and melanotic stools. Patient appears well upon arrival, hemodynamically stable, without peritoneal signs or acute reduced peripheral perfusion on exam. Will repeat h/h and electrolytes and consult vascular surgery service.ED Course:Patient care transferred to oncoming resident physician at change of shift pending vascular consultBrock Chimileski, MDPGY2, [facility] This patient was discussed with the supervising attending physician and a treatment plan and disposition were collaboratively agreed upon. Please see attending attestation for additional details. -------------------------------------------------------------------An acute or life threatening problem was considered during this evaluation  A decision regarding hospitalization was made during this visit  External data reviewed: Labs and RadiologyHistory from independent historian: Relative.Care limited by SDOH: Ambulatory Care Access.Care limited due to:  From out of [place] Directly spoke with:  Consultant  Physical ExamED Triage Vitals [08/21/22 1352]BP: (!) 176/83Pulse: 70Pulse from  O2 sat: n/aResp: 18Temp: 98 ?F (36.7 ?C)Temp src: TemporalSpO2: 98 % BP 139/76  - Pulse 61  - Temp (!) 96.9 ?F (36.1 ?C) (Oral)  - Resp 17  - SpO2 99% Physical Exam 12 Lead EKGDate/Time: 08/21/2022 6:34 PMPerformed by: Lorenza Burton, MDAuthorized by: Lorenza Burton, MD  Interpretation:   Interpretation: normal  Rate:   ECG rate:  62Rhythm:   Rhythm: sinus rhythm  QRS:   QRS axis:  NormalComments:    No acute ischemic changesInterpreted contemporaneously by meAttestation/Critical CarePatient Reevaluation: Attending Supervised: ResidentI saw and examined the patient. I agree with the findings and plan of care as documented in the resident's note except as noted below. Additional acute and/or chronic problems addressed: 89M visiting from [place] and presented to OSH w diarrhea, found to have aortic leak on [place] and transferred for vasc consult.BP (!) 176/83  - Pulse 70  - Temp 98 ?F (36.7 ?C) (Temporal)  - Resp 18  - SpO2 98% Arnoldo Hooker assumed care of this patient at time of shift change.  Patient transferred from outside hospital with concern for aortic leak on [place].  Has remained stable while in the emergency department.  I did discuss the case with the vascular surgeon directly, Dr. Roxy Horseman.  He has recommended broad-spectrum antibiotics, vancomycin and Zosyn, to cover in the event that [place] findings are related to infection, the patient will receive medications for pressure as needed and the patient will be admitted to the step-down unit under vascular surgery (attending, Dr. Leodis Binet). Gastroenterology/digestive disease  will also be consulted per their recommendation. Clinical Impressions as of 08/21/22 2014 Leaking abdominal aortic aneurysm (AAA) ([facility])  ED DispositionAdmit Reid, EArmy Fossa09/22/23 1510 ChimilJacklynn GanongDResident09/22/23 1848 ChimiJacklynn GanongMDResident09/22/23 1919 Lorenza Burton, MD09/22/23 2020 Lorenza Burton, MD09/22/23 2021

## 2022-08-22 NOTE — Treatment Summary
Team notified about subacute lower GI bleed in patient with concern for aortoenteric fistula on [place] imaging. He is awaiting a PET scan for further evaluation of aortoenteric fistula. Per chart, no blood in BM for 2 days, Hgb stable at 13.2, and stable blood pressures.No acute GI intervention is indicated right now. Patient should get evaluated for the possible enteric fistula. If PET shows no aortoenteric fistula, we can consider inpatient or outpatient colonoscopy depending on his course. Please feel free to reach out to [place] with questions.Earma Reading, MDGastroenterology and Hepatology fellow

## 2022-08-22 NOTE — Progress Notes
[facility]   Surgery Progress NoteAttending Provider: Wadie Lessen, MDAdmit Date: 9/22/2023Hospital Day: 2Code status: Full CodeAllergy: Patient has no known allergies.Background:  Cameron Zimmerman is a 88 y.o. male w/ PMHx of low grade prostate ca, CKD, CAD s/p PCI with stents 2013, Diverticulosis with prior GI bleeds, GERD, TIA, L CEA, HLD, HTN, and AAA s/p open repair in 2008 p/w LGIB with c/f graft infectionSubjective/Interm Events: - NAEON- HDS, AVSS- No further bloody BM for last 2 days- reports minimal abdo pain- On vanc + zosynObjective: Temp:  [96.8 ?F (36 ?C)-98.2 ?F (36.8 ?C)] 98.1 ?F (36.7 ?C)Pulse:  [56-73] 61Resp:  [14-22] 19BP: (85-176)/(30-91) 102/50SpO2:  [96 %-100 %] 98 %Device (Oxygen Therapy): room airI/O last 3 completed shifts:In: - Out: 850 [Urine:850]CBCLab Results Component Value Date  WBC 7.1 08/22/2022  HGB 13.5 08/22/2022  HCT 40.40 08/22/2022  PLT 206 08/22/2022 LYTESLab Results Component Value Date  NA 140 08/22/2022  K 4.3 08/22/2022  CL 110 (H) 08/22/2022  CO2 20 08/22/2022  CREATININE 1.74 (H) 08/22/2022  BUN 21 08/22/2022  GLU 82 08/22/2022  CALCIUM 8.8 08/22/2022  MG 2.4 08/22/2022  PHOS 3.1 08/22/2022 LFTsLab Results Component Value Date  BILITOT 0.5 08/21/2022  AST 21 08/21/2022  ALT 22 08/21/2022  ALKPHOS 81 08/21/2022 PT/INR/PTTLab Results Component Value Date  INR 1.02 08/21/2022  PTT 24.5 06/13/2013 Microbiology:No results found for: LABBLOO, LABURIN, LOWERRESPIRAPHYSICAL EXAMGen: in bed, NADCV: RRPulm: breathing comfortably on RAAbd: soft, minimal lower abdominal tendernessExt: wwpAssessment Cameron Zimmerman is a 88 y.o. male, Hospital Day: 2, being ebaluated for LGIB, with concern for possible graft infectionPlan - Regular diet- Can get OOB- GI consult for LGIB- Follow up PET [place]- Cont abxNotifications : For questions, please contact vascular teamSigned:Samuelson E Osifo, MBBS9/23/2023 7:30 AM- GI consult for LGIB- Per GI patient Hgb is stable no need for intervention at this time. Call back if surgical intervention is required and there is concern for worsening GIB with Hep IV and ACElectronically Signed ZO:XWRU Larin Weissberg, MDCardiothoracic Surgery PGY-2Attending Addendum:

## 2022-08-22 NOTE — ED Notes
7:11 PM report received care assumed. Pt here with c/o diarrhea x3 weeks +red stool. Pt possible aortic leak, hx of AAR. Pt pending dispo.  Labetalol for BP goal of under 160 per previous RN.Past Medical History: Diagnosis Date  CKD (chronic kidney disease) August 2012  Stage 3  Coronary artery disease   Diverticulosis   H/O gastroesophageal reflux (GERD)   no longer requires medication  History of CEA (carotid endarterectomy) February 2006  L side  HLD (hyperlipidemia)   HTN (hypertension)   S/P AAA (abdominal aortic aneurysm) repair Repair Nov 2008  Sciatica   TIA (transient ischemic attack) November 2008  possible 8:35 PM report given to SD RN, pt to be transported to SDU

## 2022-08-22 NOTE — Plan of Care
Plan of Care Overview/ Patient Status    Problem: Adult Inpatient Plan of CareGoal: Plan of Care ReviewOutcome: Interventions implemented as appropriate Neuro: AAOx4. 4/10 pain to lower abdomen. Tylenol given per order see MAR.CV: On cardiac monitor. NSR-SB with 1st AV block. SBP less than 160. +pulses AfebrileResp: clear on room airGI/GU: up to toliet with stand by assistance. No BM this shift. +BS, passing flatus. Abdomen soft, tender, and nondistendedSkin: intactMSK: ambulates independentPlan of Care: PET scan, monitor for signs/symptoms of bleeding, labs as ordered. Antibiotics given see MAR.

## 2022-08-22 NOTE — Plan of Care
Plan of Care Overview/ Patient Status    Problem: Adult Inpatient Plan of CareGoal: Plan of Care ReviewOutcome: Interventions implemented as appropriateFlowsheets (Taken 08/22/2022 0327)Progress: improvingPlan of Care Reviewed With: patient childNote: Assumed Care 2200-0700Neuro: A/Ox4; Very pleasant. MAE. AfebrileCV: SR/SB rates 50s-60s. SBP 80s-150s. Pulses palpable and warm throughout.Resp: Lungs sound clear, equal througout on RAGI/GU: NPO. Abdomen soft, nontender. Audible bowel sounds. Voiding spontaneously.Skin: Skin is intact. 2 nurse skin assessment completed. Mepilex in place.Pain: No complaints of pain.Activity: Independent.Hourly rounding maintained. All needs have been met. Call bell within reach. Turns and repositions self. Will continue to monitor. Goal: Patient-Specific Goal (Individualized)Outcome: Interventions implemented as appropriateGoal: Absence of Hospital-Acquired Illness or InjuryOutcome: Interventions implemented as appropriateGoal: Optimal Comfort and WellbeingOutcome: Interventions implemented as appropriateGoal: Readiness for Transition of CareOutcome: Interventions implemented as appropriate Problem: Fall Injury RiskGoal: Absence of Fall and Fall-Related InjuryOutcome: Interventions implemented as appropriate Problem: Gastrointestinal BleedingGoal: Optimal Coping with Acute IllnessOutcome: Interventions implemented as appropriateGoal: HemostasisOutcome: Interventions implemented as appropriate

## 2022-08-22 NOTE — Other
-  CONSULT  REQUEST  [facility] NOTE-Type of consult: [facility] Gastroenterology    -New Consult: [id number] Lyman Bishop Bonaparte /Location: 5318/5318-A / *Brief Clinical Question: Patient presented with bloody stools of 1 month. Presented to [facility] ED and [place] concerning for aortic hematoma. Patient reports history of diverticulitis also had open AAA repair in 2008 and being evaluated for LGIB vs graft erosion/Callback Cell Phone: [phone]** / Please confirm receipt of this message by texting back ?OK?-1 - Mobile Heartbeat message sent to [place] at 8:27 AM. Received response at 0827.-Advit Trethewey Hudd9/23/20238:26 [facility] [phone]

## 2022-08-22 NOTE — Other
Vascular Surgery Consult Note [facility] Day: 1 Consult attending: Dr. Sylvan Cheese from: Wadie Lessen, MDConsult question: Bloody Stools, ?Aortoenteric Fistula  HPI  Cameron Zimmerman is a 88 y.o. male with a history of prostate cancer, CKD, CAD s/p PCI with stents 2013, Diverticulosis with prior GI bleeds, GERD, TIA, L CEA, HLD, HTN, and AAA s/p open repair in 2008 in [place] who presented to the ED this morning with complaint of intermittent bloody stools for around a month. He denies abdominal pain, nausea, vomiting, chest pain, SOB. He has been otherwise eating and drinking normally. Last colonoscopy 2014 with diverticulosis. He was advised by his PCP to present to the ED for evaluation. A [place] abdomen and pelvis was performed that showed: There is soft tissue thickening around the aortic wall and extending into the right common iliac artery at the level of the iliac bifurcation, concerning for hematoma. There is fat stranding of the dome of the bladder. Correlate clinically for cystitis. Large hiatal hernia. Extensive diverticulosis but no acute diverticulitis or bowel obstruction. Vascular surgery is consulted for further evaluation and management. Of note, patient is on DAPT with ASA and Plavix.  ROS  General:  No fever, chills, weight loss, night sweats, fatigue, or malaise.Neuro:  No weakness, numbness, paresthesia, LOC, syncope, dizziness, or headache.HEENT:  No changes to vision or hearing. No hoarseness, sore throat, or difficulty swallowingCV:  No chest pain, orthopnea, or palpitations.Resp:  No SOB, cough, wheezing, or dyspnea.GI: Bloody bowel movement. No nausea, vomiting, diarrhea, constipation, dysphagia.GU:  No dysuria or urgency. Heme: Bloody bowel movements. No recent bruising, bleeding, or lymphadenopathy.Endo:  No temperature intolerance, polydipsia, or polyuria.MSK/Ext:  No arthralgia, or mylagia. No edemaSkin: No rash, lesions, or pruritis.Psych:  No behavioral disturbances. Past Medical History Past Surgical History Past Medical History: Diagnosis Date ? CKD (chronic kidney disease) August 2012  Stage 3 ? Coronary artery disease  ? Diverticulosis  ? H/O gastroesophageal reflux (GERD)   no longer requires medication ? History of CEA (carotid endarterectomy) February 2006  L side ? HLD (hyperlipidemia)  ? HTN (hypertension)  ? S/P AAA (abdominal aortic aneurysm) repair Repair Nov 2008 ? Sciatica  ? TIA (transient ischemic attack) November 2008  possible  Past Surgical History: Procedure Laterality Date ? CORONARY ANGIOPLASTY WITH STENT PLACEMENT  10/2012  x 2 DES to LAD ? VASCULAR SURGERY    Social History Family History Tobacco Use ? Smoking status: Never ? Smokeless tobacco: Never Substance Use Topics ? Alcohol use: Yes   Comment: 3 drinks of beer, wine or vodka tonic a night since 67yrs of age ? Drug use: No  Family History Problem Relation Age of Onset ? Kidney disease Mother       1 kidney, reason unknown to family ? Stroke Father  ? Heart disease Brother       MI @ 71 years ? Hearing loss Brother   Medications Allergies Prior to Admission Medication Medication Sig Dispense Refill ? aspirin 81 MG EC tablet Take 1 tablet (81 mg total) by mouth every other day. 30 tablet 5 ? atorvastatin (LIPITOR) 40 MG tablet Take 40 mg by mouth daily..   ? cholecalciferol (VITAMIN D) 1,000 unit tablet Take 2,000 Units by mouth once a week.   ? losartan (COZAAR) 100 MG tablet Take 1 tablet (100 mg total) by mouth daily.. 30 tablet 0 ? metoprolol (LOPRESSOR) 25 MG tablet Take 25 mg by mouth See Admin Instructions. Take half tablet every other day    No Known Allergies  OBJECTIVE  Vitals:Temp:  [96.9 ?F (36.1 ?C)-98 ?F (36.7 ?C)] 96.9 ?F (36.1 ?C)Pulse:  [56-70] 69Resp:  [16-18] 17BP: (139-176)/(72-88) 151/82SpO2:  [96 %-100 %] 98 %Device (Oxygen Therapy): room airI&Os:No intake/output data recorded.Physical Exam:Gen:  NAD, lying comfortably in bed, answering questions appropriatelyHEENT:  Normocephalic, atraumaticCV:  RRR, palpable distal pulsesResp:  Normal respiratory rate and normal inspiratory effort on RAAbd:  soft, nondistended, very mildly tender to deep palpation in the LLQ and RLQ and epigastrium, no rebound or guarding, no mass. Ext:  WWP, Left biphasic PT/AT signals. Right biphasic PT signals. Laboratory:CBCLab Results Component Value Date  WBC 6.8 08/21/2022  HGB 12.3 (L) 08/21/2022  HCT 37.50 (L) 08/21/2022  PLT 186 08/21/2022 ElectrolytesLab Results Component Value Date  NA 144 08/21/2022  K 4.2 08/21/2022  CL 111 (H) 08/21/2022  CO2 22 08/21/2022  CREATININE 1.68 (H) 08/21/2022  BUN 21 08/21/2022  GLU 89 08/21/2022  CALCIUM 8.7 (L) 08/21/2022 LFTsLab Results Component Value Date  BILITOT 0.5 08/21/2022  AST 21 08/21/2022  ALT 22 08/21/2022  ALKPHOS 81 08/21/2022 CoagLab Results Component Value Date  INR 1.02 08/21/2022  PTT 24.5 06/13/2013 Imaging:CXR (portable)Result Date: 9/22/2023XR CHEST PA OR AP INDICATION: cough COMPARISON: None. FINDINGS:   The cardiac silhouette is normal. There is a retrocardiac opacity containing gas consistent with a large hiatal hernia. No acute pulmonary infiltrate. Mild bibasilar streak atelectasis. The pleural spaces are clear. There is no acute osseous injury.  No acute pulmonary abnormality. Large hiatal hernia. [place] [facility] Notify System Classification: Routine Reported and signed by: Vivianne Spence, MD  [facility] Radiology and Biomedical Imaging [place] Abdomen Pelvis w IV ContrastResult Date: 9/22/2023CT ABDOMEN PELVIS W IV CONTRAST HISTORY: Diverticulitis, complication suspected COMPARISON: NONE TECHNIQUE: [place] images of the abdomen and pelvis were obtained from the diaphragms to the pubic symphysis after the administration of intravenous contrast. IV CONTRAST:  80 mL iohexoL (OMNIPAQUE) 350 mg iodine/mL injection FINDINGS: LUNG BASES: Unremarkable. LIVER: Unremarkable. GALLBLADDER: Unremarkable. SPLEEN: Unremarkable. PANCREAS: Unremarkable. ADRENALS: Unremarkable. KIDNEYS: Exophytic cyst of the superior pole of the right kidney. Cortical scarring posterior aspect of left kidney. Additional hypodense lesions of the bilateral kidneys are too small to characterize. BOWEL: Most of the stomach is herniated into the central lower chest, incompletely visualized.. No evidence of bowel obstruction. There is diverticulosis without diverticulitis. APPENDIX: Unremarkable. PERITONEUM: Unremarkable. LYMPH NODES: Unremarkable. VESSELS: There is soft tissue thickening around the aortic wall and extending into the right common iliac artery at the level of the iliac bifurcation, concerning for hematoma. Diffuse aortic and iliac calcifications. Right greater than left iliac arteries are very ectatic. URINARY BLADDER: There is fat stranding of the dome of the bladder. PELVIS: Unremarkable. BONES & SOFT TISSUE: Radiopaque fragment in the prevesical space. Bilateral right greater than left fat-containing inguinal hernias. 1. There is soft tissue thickening around the aortic wall and extending into the right common iliac artery at the level of the iliac bifurcation, concerning for hematoma. 2. There is fat stranding of the dome of the bladder. Correlate clinically for cystitis. 3. Large hiatal hernia. 4. Extensive diverticulosis but no acute diverticulitis or bowel obstruction. [place] [facility] Notify System Classification: Routine Report initiated by:  Norlene Duel, MD Reported and signed by: Virl Axe, MD  [facility] Radiology and Biomedical Imaging   ASSESSMENT AND PLAN  Taaj Joncas is a 88 y.o. 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 He also has a history of diverticular bleeds, and we will reach out to GI for evaluation for non-aortic graft related causes of his bloody stools. We will start empiric broad spectrum antibiotics and obtain a PET/[place] to evaluate for graft infection with aortoenteric fistula. We will monitor him closely in the SDU given the possibility of worsening bleeding. - Admit to Vascular Surgery, SDU, Dr. Dimple Casey- Vancomycin and zosyn- PET/[place]- GI consult- Serial abdominal exams and labs, trend H/H- BP control, SBP less than 160- NPO/IVF- Appropriate home meds, hold DAPTFor questions, please page: Vascular Surgery Floor [phone] (daytime), [phone] [facility] Consult - Overnights/weekends)Discussed with chief resident Dr. Tiburcio Pea and attending Dr. Dimple Casey.All notes preliminary until final attending attestation.Signed:Charly Holcomb Maryan Char, MD09/22/239:26 PM===ATTENDING ADDENDUM:

## 2022-08-23 LAB — BASIC METABOLIC PANEL
BKR ANION GAP: 10 (ref 7–17)
BKR BLOOD UREA NITROGEN: 22 mg/dL (ref 8–23)
BKR BUN / CREAT RATIO: 12.4 (ref 8.0–23.0)
BKR CALCIUM: 9.1 mg/dL (ref 8.8–10.2)
BKR CHLORIDE: 110 mmol/L — ABNORMAL HIGH (ref 98–107)
BKR CO2: 21 mmol/L (ref 20–30)
BKR CREATININE: 1.78 mg/dL — ABNORMAL HIGH (ref 0.40–1.30)
BKR EGFR, CREATININE (CKD-EPI 2021): 37 mL/min/{1.73_m2} — ABNORMAL LOW (ref >=60)
BKR GLUCOSE: 79 mg/dL (ref 70–100)
BKR POTASSIUM: 4.5 mmol/L (ref 3.3–5.3)
BKR SODIUM: 141 mmol/L (ref 136–144)

## 2022-08-23 LAB — CBC WITH AUTO DIFFERENTIAL
BKR WAM ABSOLUTE IMMATURE GRANULOCYTES.: 0.03 x 1000/ÂµL (ref 0.00–0.30)
BKR WAM ABSOLUTE LYMPHOCYTE COUNT.: 1.9 x 1000/ÂµL (ref 0.60–3.70)
BKR WAM ABSOLUTE NRBC (2 DEC): 0 x 1000/ÂµL (ref 0.00–1.00)
BKR WAM ANALYZER ANC: 4.22 x 1000/ÂµL (ref 2.00–7.60)
BKR WAM BASOPHIL ABSOLUTE COUNT.: 0.1 x 1000/ÂµL (ref 0.00–1.00)
BKR WAM BASOPHILS: 1.4 % (ref 0.0–1.4)
BKR WAM EOSINOPHIL ABSOLUTE COUNT.: 0.33 x 1000/ÂµL (ref 0.00–1.00)
BKR WAM EOSINOPHILS: 4.6 % (ref 0.0–5.0)
BKR WAM HEMATOCRIT (2 DEC): 43.2 % (ref 38.50–50.00)
BKR WAM HEMOGLOBIN: 14 g/dL (ref 13.2–17.1)
BKR WAM IMMATURE GRANULOCYTES: 0.4 % (ref 0.0–1.0)
BKR WAM LYMPHOCYTES: 26.4 % (ref 17.0–50.0)
BKR WAM MCH (PG): 33.5 pg — ABNORMAL HIGH (ref 27.0–33.0)
BKR WAM MCHC: 32.4 g/dL (ref 31.0–36.0)
BKR WAM MCV: 103.3 fL — ABNORMAL HIGH (ref 80.0–100.0)
BKR WAM MONOCYTE ABSOLUTE COUNT.: 0.61 x 1000/ÂµL (ref 0.00–1.00)
BKR WAM MONOCYTES: 8.5 % (ref 4.0–12.0)
BKR WAM MPV: 9.9 fL (ref 8.0–12.0)
BKR WAM NEUTROPHILS: 58.7 % (ref 39.0–72.0)
BKR WAM NUCLEATED RED BLOOD CELLS: 0 % (ref 0.0–1.0)
BKR WAM PLATELETS: 222 x1000/ÂµL (ref 150–420)
BKR WAM RDW-CV: 13.1 % (ref 11.0–15.0)
BKR WAM RED BLOOD CELL COUNT.: 4.18 M/ÂµL (ref 4.00–6.00)
BKR WAM WHITE BLOOD CELL COUNT: 7.2 x1000/ÂµL (ref 4.0–11.0)

## 2022-08-23 LAB — MAGNESIUM: BKR MAGNESIUM: 2.4 mg/dL (ref 1.7–2.4)

## 2022-08-23 LAB — PHOSPHORUS     (BH GH L LMW YH): BKR PHOSPHORUS: 3 mg/dL (ref 2.2–4.5)

## 2022-08-23 MED ORDER — ROSUVASTATIN 20 MG TABLET
20 mg | Freq: Every evening | ORAL | Status: DC
Start: 2022-08-23 — End: 2022-08-29
  Administered 2022-08-24 – 2022-08-28 (×5): 20 mg via ORAL

## 2022-08-23 MED ORDER — CLOPIDOGREL 75 MG TABLET
75 mg | Freq: Every day | ORAL | Status: DC
Start: 2022-08-23 — End: 2022-08-29
  Administered 2022-08-23 – 2022-08-28 (×6): 75 mg via ORAL

## 2022-08-23 MED ORDER — PIPERACILLIN-TAZOBACTAM (ZOSYN) 3.375GM MBP
Freq: Four times a day (QID) | INTRAVENOUS | Status: DC
Start: 2022-08-23 — End: 2022-08-28
  Administered 2022-08-23 – 2022-08-28 (×21): 50.000 mL/h via INTRAVENOUS

## 2022-08-23 MED ORDER — ASPIRIN 81 MG TABLET,DELAYED RELEASE
81 mg | ORAL | Status: DC
Start: 2022-08-23 — End: 2022-08-29
  Administered 2022-08-23 – 2022-08-27 (×3): 81 mg via ORAL

## 2022-08-23 NOTE — Plan of Care
Problem: Adult Inpatient Plan of CareGoal: Plan of Care ReviewOutcome: Interventions implemented as appropriateFlowsheets (Taken 08/23/2022 1428)Progress: improvingPlan of Care Reviewed With: patient Plan of Care Overview/ Patient Status    (0700-1900):Pt A&Ox4. No c/o pain. VSS with room air. NSR/ first degree heart block on tele. Ambulating independently in hallway. Voiding spontaneously. BM x1 this shift, no blood present. Call bell in reach. Family at bedside, attentive to patient. Monitoring continued, rounded on hourly. ADDENDUM:1825: C/o lower abdominal pain r/t chronic hernia. PRN tylenol given.

## 2022-08-23 NOTE — Plan of Care
Problem: Adult Inpatient Plan of CareGoal: Readiness for Transition of CareOutcome: Interventions implemented as appropriate Plan of Care Overview/ Patient Status    Case Management Screening  Flowsheet Row Most Recent Value Case Management Screening: Chart review completed. If YES to any question below then proceed to CM Eval/Plan  Is there a change in their cognitive function No Do you anticipate a change in this patient's physicial function that will effect discharge needs? No Has there been a readmission within the last 30 days No Were there services prior to admission ( Examples: Assisted Living, HD, Homecare, Extended Care Facility, Methadone, SNF, Outpatient Infusion Center) No Negative/Positive Screen Negative Screening: Case Management department will follow patient's progress and discuss plan of care with treatment team. Case Manager Attestation  I have reviewed the medical record and completed the above screen. CM staff will follow patient's progress and discuss the plan of care with the Treatment Team. Yes  Case Management Evaluation  Flowsheet Row Most Recent Value Case Management Evaluation and Plan  Arrived from prior to admission home/apartment/condo  [house at home in [facility]. pt staying in [place] with son Dag at [address] [place] that is house 3STE second floor bedroom]  Lives With Alone  [pt lives alone in [place] but is staying with son in [place]] Services Prior to Admission none Prior to Hospitalization: Assistance Needed/DME being used None [employer] fast streamer (name/number) in [facility] patient has cane, walker and wheelchair Documented Insurance Accurate Yes Any financial concerns related to anticipated discharge needs No Patient's home address verified Yes Patient's PCP of record verified Yes Living Environment   Lives With Alone  [pt lives alone in [place] but is staying with son in [place]] Current Living Arrangements home/apartment/condo Source of Clinical History  Patient's clinical history has been reviewed and source of Information is: Child(ren) Discharge Planning Coordination Recommendations  Discharge Planning Coordination Recommendations Needs not determined at this time Case Manager reviewed plan of care/ continuum of care need's with  Family  Cameron Zimmerman is a 88 y.o. male w/ PMHx of low grade prostate ca, CKD, CAD s/p PCI with stents 2013, Diverticulosis with prior GI bleeds, GERD, TIA, L CEA, HLD, HTN, and AAA s/p open repair in 2008 p/w LGIB with c/f graft infection Spoke with Yancey Flemings daughter via phone. Pt lives alone in [place] at baseline but is staying with son in [place]. Family is support and transport. Pt ambulates independently. Pt has no VNA. CM following for needs. No needs anticipated currently. (Unit RN to place Cardiac Rehab referral for all MI, CAD, Angina, CHF, Heart surgery, CABG, PCI, angioplasty, stent, Valve replacement, pacemaker, ICD, aneurysm repair)[facility]) [phone]

## 2022-08-23 NOTE — Plan of Care
Plan of Care Overview/ Patient Status    Assumed care from 1900-0700: Neuro: A+Ox4, VSS.Resp: RA, lung sounds clear bilaterally.Cardio: Normal sinus/sinus brady on tele, HR in 50's-60's.MSK: Independent. Skin: CDI.GI/GU: Voiding spontaneously, last BM 9/22.IV: L PIV patent, dressing CDI. Pain: No complaints of pain. Call bell in reach, safety maintained. See flow sheets for additional details.  Problem: Adult Inpatient Plan of CareGoal: Plan of Care ReviewOutcome: Interventions implemented as appropriate Problem: Adult Inpatient Plan of CareGoal: Patient-Specific Goal (Individualized)Outcome: Interventions implemented as appropriate Problem: Adult Inpatient Plan of CareGoal: Absence of Hospital-Acquired Illness or InjuryOutcome: Interventions implemented as appropriate Problem: Adult Inpatient Plan of CareGoal: Optimal Comfort and WellbeingOutcome: Interventions implemented as appropriate Problem: Fall Injury RiskGoal: Absence of Fall and Fall-Related InjuryOutcome: Interventions implemented as appropriate Problem: Gastrointestinal BleedingGoal: Optimal Coping with Acute IllnessOutcome: Interventions implemented as appropriate Problem: Gastrointestinal BleedingGoal: HemostasisOutcome: Interventions implemented as appropriate

## 2022-08-23 NOTE — Progress Notes
[facility]   Surgery Progress NoteAttending Provider: Wadie Lessen, MDAdmit Date: 9/22/2023Hospital Day: 3Code status: Full CodeAllergy: Patient has no known allergies.Background:  Cameron Zimmerman is a 88 y.o. male w/ PMHx of low grade prostate ca, CKD, CAD s/p PCI with stents 2013, Diverticulosis with prior GI bleeds, GERD, TIA, L CEA, HLD, HTN, and AAA s/p open repair in 2008 p/w LGIB with c/f graft infectionSubjective/Interm Events: - NAEON- HDS, AVSS- Reports less abdo pain today- Yet to have PET [place] (scan to happen earliest Monday)- On vanc + zosynObjective: Temp:  [97.4 ?F (36.3 ?C)-97.7 ?F (36.5 ?C)] 97.5 ?F (36.4 ?C)Pulse:  [57-73] 66Resp:  [18-20] 20BP: (109-148)/(60-75) 135/74SpO2:  [95 %-99 %] 98 %Device (Oxygen Therapy): room airI/O last 3 completed shifts:In: 400 [P.O.:250; IV Piggyback:150]Out: 825 [Urine:825]CBCLab Results Component Value Date  WBC 7.2 08/23/2022  HGB 14.0 08/23/2022  HCT 43.20 08/23/2022  PLT 222 08/23/2022 LYTESLab Results Component Value Date  NA 141 08/23/2022  K 4.5 08/23/2022  CL 110 (H) 08/23/2022  CO2 21 08/23/2022  CREATININE 1.78 (H) 08/23/2022  BUN 22 08/23/2022  GLU 79 08/23/2022  CALCIUM 9.1 08/23/2022  MG 2.4 08/23/2022  PHOS 3.0 08/23/2022 LFTsLab Results Component Value Date  BILITOT 0.5 08/21/2022  AST 21 08/21/2022  ALT 22 08/21/2022  ALKPHOS 81 08/21/2022 PT/INR/PTTLab Results Component Value Date  INR 1.02 08/21/2022  PTT 24.5 06/13/2013 Microbiology:No results found for: LABBLOO, LABURIN, LOWERRESPIRAPHYSICAL EXAMGen: in bed, NADCV: RRPulm: breathing comfortably on RAAbd: soft, minimal lower abdominal tendernessExt: wwpAssessment Cameron Zimmerman is a 88 y.o. male, Hospital Day: 3, being ebaluated for LGIB, with concern for possible graft infectionPlan - Regular diet- Follow up PET [place]- Cont abxNotifications : For questions, please contact vascular teamSigned:Zaray Gatchel E Bowman Higbie, MBBS9/24/2023 9:00 AMAttending Addendum:

## 2022-08-24 LAB — BASIC METABOLIC PANEL
BKR ANION GAP: 12 (ref 7–17)
BKR BLOOD UREA NITROGEN: 23 mg/dL (ref 8–23)
BKR BUN / CREAT RATIO: 11.7 (ref 8.0–23.0)
BKR CALCIUM: 9.4 mg/dL (ref 8.8–10.2)
BKR CHLORIDE: 108 mmol/L — ABNORMAL HIGH (ref 98–107)
BKR CO2: 22 mmol/L (ref 20–30)
BKR CREATININE: 1.97 mg/dL — ABNORMAL HIGH (ref 0.40–1.30)
BKR EGFR, CREATININE (CKD-EPI 2021): 32 mL/min/{1.73_m2} — ABNORMAL LOW (ref >=60)
BKR GLUCOSE: 84 mg/dL (ref 70–100)
BKR POTASSIUM: 4.3 mmol/L (ref 3.3–5.3)
BKR SODIUM: 142 mmol/L (ref 136–144)

## 2022-08-24 LAB — CBC WITH AUTO DIFFERENTIAL
BKR WAM ABSOLUTE IMMATURE GRANULOCYTES.: 0.03 x 1000/ÂµL (ref 0.00–0.30)
BKR WAM ABSOLUTE LYMPHOCYTE COUNT.: 2.17 x 1000/ÂµL (ref 0.60–3.70)
BKR WAM ABSOLUTE NRBC (2 DEC): 0 x 1000/ÂµL (ref 0.00–1.00)
BKR WAM ANALYZER ANC: 5.33 x 1000/ÂµL (ref 2.00–7.60)
BKR WAM BASOPHIL ABSOLUTE COUNT.: 0.09 x 1000/ÂµL (ref 0.00–1.00)
BKR WAM BASOPHILS: 1 % (ref 0.0–1.4)
BKR WAM EOSINOPHIL ABSOLUTE COUNT.: 0.35 x 1000/ÂµL (ref 0.00–1.00)
BKR WAM EOSINOPHILS: 4 % (ref 0.0–5.0)
BKR WAM HEMATOCRIT (2 DEC): 43.6 % (ref 38.50–50.00)
BKR WAM HEMOGLOBIN: 14.5 g/dL (ref 13.2–17.1)
BKR WAM IMMATURE GRANULOCYTES: 0.3 % (ref 0.0–1.0)
BKR WAM LYMPHOCYTES: 24.9 % (ref 17.0–50.0)
BKR WAM MCH (PG): 34.4 pg — ABNORMAL HIGH (ref 27.0–33.0)
BKR WAM MCHC: 33.3 g/dL (ref 31.0–36.0)
BKR WAM MCV: 103.6 fL — ABNORMAL HIGH (ref 80.0–100.0)
BKR WAM MONOCYTE ABSOLUTE COUNT.: 0.76 x 1000/ÂµL (ref 0.00–1.00)
BKR WAM MONOCYTES: 8.7 % (ref 4.0–12.0)
BKR WAM MPV: 10.2 fL (ref 8.0–12.0)
BKR WAM NEUTROPHILS: 61.1 % (ref 39.0–72.0)
BKR WAM NUCLEATED RED BLOOD CELLS: 0 % (ref 0.0–1.0)
BKR WAM PLATELETS: 207 x1000/ÂµL (ref 150–420)
BKR WAM RDW-CV: 13.4 % (ref 11.0–15.0)
BKR WAM RED BLOOD CELL COUNT.: 4.21 M/ÂµL (ref 4.00–6.00)
BKR WAM WHITE BLOOD CELL COUNT: 8.7 x1000/ÂµL (ref 4.0–11.0)

## 2022-08-24 LAB — MAGNESIUM: BKR MAGNESIUM: 2.4 mg/dL (ref 1.7–2.4)

## 2022-08-24 LAB — PHOSPHORUS     (BH GH L LMW YH): BKR PHOSPHORUS: 3.4 mg/dL (ref 2.2–4.5)

## 2022-08-24 MED ORDER — METOPROLOL SUCCINATE ER 25 MG TABLET,EXTENDED RELEASE 24 HR
25 mg | Freq: Every day | ORAL | Status: AC
Start: 2022-08-24 — End: ?

## 2022-08-24 MED ORDER — PRALUENT PEN 75 MG/ML SUBCUTANEOUS PEN INJECTOR
75 mg/mL | SUBCUTANEOUS | Status: AC
Start: 2022-08-24 — End: ?

## 2022-08-24 MED ORDER — CHOLECALCIFEROL (VITAMIN D3) 25 MCG (1,000 UNIT) TABLET
25 mcg (1,000 unit) | ORAL | Status: DC
Start: 2022-08-24 — End: 2022-08-29
  Administered 2022-08-24: 12:00:00 25 mcg (1,000 unit) via ORAL

## 2022-08-24 MED ORDER — VITAMIN C ORAL
Freq: Every day | ORAL | Status: AC
Start: 2022-08-24 — End: ?

## 2022-08-24 MED ORDER — LOSARTAN 50 MG TABLET
50 mg | Freq: Every day | ORAL | Status: DC
Start: 2022-08-24 — End: 2022-08-29
  Administered 2022-08-24 – 2022-08-28 (×5): 50 mg via ORAL

## 2022-08-24 MED ORDER — CLOPIDOGREL 75 MG TABLET
75 mg | Freq: Every day | ORAL | Status: AC
Start: 2022-08-24 — End: ?

## 2022-08-24 MED ORDER — FEBUXOSTAT 40 MG TABLET
40 mg | Freq: Every day | ORAL | Status: AC
Start: 2022-08-24 — End: ?

## 2022-08-24 MED ORDER — VITAMIN B-1 ORAL
Freq: Every day | ORAL | Status: AC
Start: 2022-08-24 — End: ?

## 2022-08-24 NOTE — Other
PHARMACY-ASSISTED MEDICATION REPORTPharmacist review of the best possible medication history obtained by the pharmacy medication history technician has been performed.  I have updated the home medication list and identified the following information that may be relevant to this admission.The following home medications were not restarted upon admission. Consider ordering if clinically appropriate.febuxostat        Prior to Admission Medications Medication Name Sig Taking? Patient Reported   alirocumab (PRALUENT PEN) 75 mg/mL Pen InjectorLast dose:  -- Last Medication Note: >> Binnie Rail Aug 24, 2022 12:51 PMMedHx Tech [facility], CPHT): patient reports due in 5 days Entered by Ernst Bowler, CPHT Mon Aug 24, 2022 1251 Inject 1 mL (75 mg total) under the skin every 14 (fourteen) days. Yes Yes   ascorbic acid (VITAMIN C ORAL)Last dose:  --  Take 1 tablet by mouth Daily @1700 . Yes Yes   aspirin 81 MG EC tabletLast dose:  --  Take 1 tablet (81 mg total) by mouth every other day. Yes     Discontinued: 08/24/2022  2:21 PMLast dose: Not TakingLast Medication Note: >> Binnie Rail Aug 24, 2022 12:53 PMMedHx Tech(Keana Conyers, CPHT): FLAG FOR REMOVAL -  patient reports no longer takingEntered by Ernst Bowler, CPHT Mon Aug 24, 2022 1253   Yes   Discontinued: 08/24/2022  2:21 PMLast dose:  -- Last Medication Note: >> Binnie Rail Aug 24, 2022 12:52 PMMedHx Tech [facility] [place], [place]): patient reports takes on Wednesdays Entered by Ernst Bowler, CPHT Mon Aug 24, 2022 1252 Yes Yes   clopidogreL (PLAVIX) 75 mg tabletLast dose:  --  Take 1 tablet (75 mg total) by mouth daily. Yes Yes   febuxostat (ULORIC) 40 mg tabletLast dose:  -- Last Medication Note: >> Binnie Rail Aug 24, 2022 12:50 PMMedHx Tech [facility] [place], [place]): patient reports 1/2 tablet every other dayEntered by Ernst Bowler, CPHT Mon Aug 24, 2022 1250 Take 0.5 tablets (20 mg total) by mouth daily. Yes Yes   losartan (COZAAR) 100 MG tabletLast dose:  -- Last Medication Note: >> Binnie Rail Aug 24, 2022 12:49 PMMedHx Tech [facility] [place], [place]): patient reports 1/2 tablet daily per mdEntered by Ernst Bowler, CPHT Mon Aug 24, 2022 1249 Take 1 tablet (100 mg total) by mouth daily.. Yes     Discontinued: 08/24/2022  2:21 PMLast dose: Not TakingLast Medication Note: >> Binnie Rail Aug 24, 2022 12:54 PMMedHx Tech(Keana Conyers, CPHT): FLAG FOR REMOVAL -  see other metoprolol entry and delete this one please Entered by Ernst Bowler, CPHT Mon Aug 24, 2022 1254   Yes   metoprolol succinate XL (TOPROL-XL) 25 mg 24 hr tabletLast dose:  -- Last Medication Note: >> Binnie Rail Aug 24, 2022 12:48 PMMedHx Tech [facility] [place], [place]): patient reports 1/2 tablet daily Entered by Ernst Bowler, CPHT Mon Aug 24, 2022 1248 Take 1 tablet (25 mg total) by mouth daily. Take with or immediately following a meal. Yes Yes   thiamine HCl (VITAMIN B-1 ORAL)Last dose:  --  Take 1 tablet by mouth daily. Yes Yes   Prior to admission medications last reviewed by Lauretta Chester, PharmD on Mon Aug 24, 2022 1421 Thank you,Majesta Leichter, PharmD9/25/20232:22 PM

## 2022-08-24 NOTE — Progress Notes
[facility]   Surgery Progress NoteAttending Provider: Wadie Lessen, MDAdmit Date: 9/22/2023Hospital Day: 4Code status: Full CodeAllergy: Patient has no known allergies.Background:  Cameron Zimmerman is a 88 y.o. male w/ PMHx of low grade prostate ca, CKD, CAD s/p PCI with stents 2013, Diverticulosis with prior GI bleeds, GERD, TIA, L CEA, HLD, HTN, and AAA s/p open repair in 2008 p/w LGIB with c/f graft infectionSubjective/Interm Events: - NAEON- HDS, AVSS- Mild abdo pain- Home ASA, plavix, statin and losartan restarted- PET [place] pending (scan to happen earliest Monday)- On vanc + zosyn (day 3)Objective: Temp:  [97.2 ?F (36.2 ?C)-98.5 ?F (36.9 ?C)] 98.5 ?F (36.9 ?C)Pulse:  [57-73] 62Resp:  [16-20] 16BP: (115-144)/(60-75) 127/70SpO2:  [97 %-100 %] 97 %Device (Oxygen Therapy): room airI/O last 3 completed shifts:In: 103 [P.O.:100; I.V.:3]Out: 1280 [Urine:1280]CBCLab Results Component Value Date  WBC 7.2 08/23/2022  HGB 14.0 08/23/2022  HCT 43.20 08/23/2022  PLT 222 08/23/2022 LYTESLab Results Component Value Date  NA 141 08/23/2022  K 4.5 08/23/2022  CL 110 (H) 08/23/2022  CO2 21 08/23/2022  CREATININE 1.78 (H) 08/23/2022  BUN 22 08/23/2022  GLU 79 08/23/2022  CALCIUM 9.1 08/23/2022  MG 2.4 08/23/2022  PHOS 3.0 08/23/2022 LFTsLab Results Component Value Date  BILITOT 0.5 08/21/2022  AST 21 08/21/2022  ALT 22 08/21/2022  ALKPHOS 81 08/21/2022 PT/INR/PTTLab Results Component Value Date  INR 1.02 08/21/2022  PTT 24.5 06/13/2013 Microbiology:No results found for: LABBLOO, LABURIN, LOWERRESPIRAPHYSICAL EXAMGen: in bed, NADCV: RRPulm: breathing comfortably on RAAbd: soft, minimal lower abdominal tendernessExt: wwpAssessment Cameron Zimmerman is a 88 y.o. male, Hospital Day: 4, being ebaluated for LGIB, with concern for possible graft infectionPlan - Regular diet- Follow up PET [place] today- Review after PET [place]- Cont abxNotifications : For questions, please contact vascular teamSigned:Darcie Mellone E Glen Blatchley, MBBS9/25/2023 6:00 AMAttending Addendum:

## 2022-08-24 NOTE — Plan of Care
Plan of Care Overview/ Patient Status    1900-0700Neuro: Alert and oriented to surrounding environment. HOH bilateral hearing aids @bedside  on the charging stationCV: NSR 1 AVB on teleResp: Lung sounds CTA currently remains on room airGI/GU: Voiding spontaneously. LBM 9/24Pain: C/o slight abdominal discomfort secondary to hernia no interventions required at this timeSkin: IntactMobility: Ambulates independently. Gait steady.Diet: Drinking well.MISC: Slept well during the shift (chest ise and fall). No active issues to report.Plan: Discharge planning when medically cleared.Problem: Adult Inpatient Plan of CareGoal: Plan of Care ReviewOutcome: Interventions implemented as appropriateFlowsheets (Taken 08/24/2022 0359)Plan of Care Reviewed With: patientGoal: Patient-Specific Goal (Individualized)Outcome: Interventions implemented as appropriateFlowsheets (Taken 08/22/2022 2000 by Sanjuana Letters, RN)Patient Centered Daily Goal: PET scanGoal: Absence of Hospital-Acquired Illness or InjuryOutcome: Interventions implemented as appropriateIntervention: Identify and Manage Fall RiskFlowsheets (Taken 08/23/2022 2000)Universal Fall Prevention: keep room clutter free with open pathway to the bathroom fall prevention program maintained keep bed in low position with > 1 bottom side rails down maintain mobilization plan (per RN judgment or PT/OT plan if consulted) mobility aid in reach nonskid shoes/slippers when out of bed room organization consistent safety round/check completedIntervention: Prevent Skin InjuryFlowsheets (Taken 08/23/2022 2011)Body Position: independentIntervention: Prevent InfectionFlowsheets (Taken 08/23/2022 2000)Infection Prevention: environmental surveillance performed equipment surfaces disinfected hand hygiene promoted personal protective equipment utilized rest/sleep promotedGoal: Optimal Comfort and WellbeingOutcome: Interventions implemented as appropriateIntervention: Monitor Pain and Promote ComfortFlowsheets (Taken 08/24/2022 0359)Pain Management Interventions: care clustered pain management plan reviewed with patient/caregiver relaxation techniques promotedIntervention: Provide Person-Centered CareFlowsheets (Taken 08/23/2022 2000)Trust Relationship/Rapport: care explained choices provided questions answered questions encouraged thoughts/feelings acknowledgedGoal: Readiness for Transition of CareOutcome: Interventions implemented as appropriate Problem: Gastrointestinal BleedingGoal: Optimal Coping with Acute IllnessOutcome: Interventions implemented as appropriateIntervention: Optimize Psychosocial ResponseFlowsheets (Taken 08/23/2022 2000)Supportive Measures: active listening utilized verbalization of feelings encouragedGoal: HemostasisOutcome: Interventions implemented as appropriate

## 2022-08-24 NOTE — Plan of Care
Plan of Care Overview/ Patient Status    Cameron Zimmerman is a 88 y.o. male who was admitted on 08/21/2022.Surgeries this admission: No admission procedures for hospital encounter. No admission procedures for hospital encounter. Past Medical History: Diagnosis Date ? CKD (chronic kidney disease) August 2012  Stage 3 ? Coronary artery disease  ? Diverticulosis  ? H/O gastroesophageal reflux (GERD)   no longer requires medication ? History of CEA (carotid endarterectomy) February 2006  L side ? HLD (hyperlipidemia)  ? HTN (hypertension)  ? S/P AAA (abdominal aortic aneurysm) repair Repair Nov 2008 ? Sciatica  ? TIA (transient ischemic attack) November 2008  possible Neuro/Psych: Pt is alert and oriented x4, able to make needs known.Mobility: Ambulates independently. Turns and repositions independently, instructed to change positions q2h to prevent skin breakdown.Safety: Educated on use of call light, call light and personal items placed within reach.  Bed in low locked position. Calls for help as needed. Respiratory: Lungs clear on room air.CV: 1st degree AVB w/ PAC's on telemetry.  Denies chest pain.Skin: Intact. Tolerating diet: Yes, Pain controlled: Yes, Bowel function - flatus: Yes, Bowel movements: Yes, and Urination: Spontaneous voidingActive Lines   Active PICC Line / CVC Line / ART Line   None     Plan: PET scan Thursday, earlier if possible. Possible transfer to floor.Please see flowsheets for full assessments.   Problem: Adult Inpatient Plan of CareGoal: Plan of Care ReviewOutcome: Interventions implemented as appropriateGoal: Patient-Specific Goal (Individualized)Outcome: Interventions implemented as appropriateGoal: Absence of Hospital-Acquired Illness or InjuryOutcome: Interventions implemented as appropriateGoal: Optimal Comfort and WellbeingOutcome: Interventions implemented as appropriateGoal: Readiness for Transition of CareOutcome: Interventions implemented as appropriate Problem: Fall Injury RiskGoal: Absence of Fall and Fall-Related InjuryOutcome: Interventions implemented as appropriate Problem: Gastrointestinal BleedingGoal: Optimal Coping with Acute IllnessOutcome: Interventions implemented as appropriateGoal: HemostasisOutcome: Interventions implemented as appropriate

## 2022-08-25 LAB — CBC WITH AUTO DIFFERENTIAL
BKR WAM ABSOLUTE IMMATURE GRANULOCYTES.: 0.06 x 1000/ÂµL (ref 0.00–0.30)
BKR WAM ABSOLUTE LYMPHOCYTE COUNT.: 1.65 x 1000/ÂµL (ref 0.60–3.70)
BKR WAM ABSOLUTE NRBC (2 DEC): 0 x 1000/ÂµL (ref 0.00–1.00)
BKR WAM ANALYZER ANC: 9.43 x 1000/ÂµL — ABNORMAL HIGH (ref 2.00–7.60)
BKR WAM BASOPHIL ABSOLUTE COUNT.: 0.09 x 1000/ÂµL (ref 0.00–1.00)
BKR WAM BASOPHILS: 0.7 % (ref 0.0–1.4)
BKR WAM EOSINOPHIL ABSOLUTE COUNT.: 0.37 x 1000/ÂµL (ref 0.00–1.00)
BKR WAM EOSINOPHILS: 3 % (ref 0.0–5.0)
BKR WAM HEMATOCRIT (2 DEC): 42.5 % (ref 38.50–50.00)
BKR WAM HEMOGLOBIN: 14.5 g/dL (ref 13.2–17.1)
BKR WAM IMMATURE GRANULOCYTES: 0.5 % (ref 0.0–1.0)
BKR WAM LYMPHOCYTES: 13.3 % — ABNORMAL LOW (ref 17.0–50.0)
BKR WAM MCH (PG): 34.9 pg — ABNORMAL HIGH (ref 27.0–33.0)
BKR WAM MCHC: 34.1 g/dL (ref 31.0–36.0)
BKR WAM MCV: 102.2 fL — ABNORMAL HIGH (ref 80.0–100.0)
BKR WAM MONOCYTE ABSOLUTE COUNT.: 0.84 x 1000/ÂµL (ref 0.00–1.00)
BKR WAM MONOCYTES: 6.8 % (ref 4.0–12.0)
BKR WAM MPV: 9.9 fL (ref 8.0–12.0)
BKR WAM NEUTROPHILS: 75.7 % — ABNORMAL HIGH (ref 39.0–72.0)
BKR WAM NUCLEATED RED BLOOD CELLS: 0 % (ref 0.0–1.0)
BKR WAM PLATELETS: 201 x1000/ÂµL (ref 150–420)
BKR WAM RDW-CV: 13.3 % (ref 11.0–15.0)
BKR WAM RED BLOOD CELL COUNT.: 4.16 M/ÂµL (ref 4.00–6.00)
BKR WAM WHITE BLOOD CELL COUNT: 12.4 x1000/ÂµL — ABNORMAL HIGH (ref 4.0–11.0)

## 2022-08-25 LAB — BASIC METABOLIC PANEL
BKR ANION GAP: 14 (ref 7–17)
BKR BLOOD UREA NITROGEN: 26 mg/dL — ABNORMAL HIGH (ref 8–23)
BKR BUN / CREAT RATIO: 12.6 (ref 8.0–23.0)
BKR CALCIUM: 9.3 mg/dL (ref 8.8–10.2)
BKR CHLORIDE: 108 mmol/L — ABNORMAL HIGH (ref 98–107)
BKR CO2: 20 mmol/L (ref 20–30)
BKR CREATININE: 2.06 mg/dL — ABNORMAL HIGH (ref 0.40–1.30)
BKR EGFR, CREATININE (CKD-EPI 2021): 31 mL/min/{1.73_m2} — ABNORMAL LOW (ref >=60)
BKR GLUCOSE: 97 mg/dL (ref 70–100)
BKR POTASSIUM: 4.3 mmol/L (ref 3.3–5.3)
BKR SODIUM: 142 mmol/L (ref 136–144)

## 2022-08-25 LAB — PHOSPHORUS     (BH GH L LMW YH): BKR PHOSPHORUS: 3.9 mg/dL (ref 2.2–4.5)

## 2022-08-25 LAB — MAGNESIUM: BKR MAGNESIUM: 2.3 mg/dL (ref 1.7–2.4)

## 2022-08-25 MED ORDER — VANCOMYCIN MAR LEVEL
Freq: Once | INTRAVENOUS | Status: CP
Start: 2022-08-25 — End: ?

## 2022-08-25 MED ORDER — SODIUM CHLORIDE 0.9 % INTRAVENOUS SOLUTION
INTRAVENOUS | Status: DC
Start: 2022-08-25 — End: 2022-08-25

## 2022-08-25 MED ORDER — VANCOMYCIN 1 G IN 250 ML IVPB (VIALMATE)
Freq: Once | INTRAVENOUS | Status: CP
Start: 2022-08-25 — End: ?
  Administered 2022-08-26: 03:00:00 250.000 mL/h via INTRAVENOUS

## 2022-08-25 MED ORDER — VANCOMYCIN THERAPY PLACEHOLDER
INTRAVENOUS | Status: DC
Start: 2022-08-25 — End: 2022-08-28

## 2022-08-25 NOTE — Plan of Care
Plan of Care Overview/ Patient Status    Neuro: A&Ox4. Hearing aids used for communication. Activity: Independent Lungs: RACardiac: NSR/ 1st degree on tele. NV: Right and Left DP/PT palpable. Gi: Bowel sounds audible. Last bowel movement 9/25Gu: Voiding without difficulty  Skin: BUE ruddy. Warm dry. Pt reports no pain. Call light within reach, reminded pt to call for assistance. Please see flowsheets for details.  Problem: Adult Inpatient Plan of CareGoal: Plan of Care ReviewOutcome: Interventions implemented as appropriateFlowsheetsTaken 08/24/2022 0359 by Laureen Ochs, RNPlan of Care Reviewed With: patientTaken 08/23/2022 1428 by Joelene Millin, RNProgress: improving

## 2022-08-25 NOTE — Transfer Summaries
TRANSFER OUT NOTEASSESSMENTNeuro: Cognitive/Neuro/Behavioral WDL: WDLCardiac: Cardiac WDL: .WDL except;cardiac monitorTelemetry: 	[]  Yes		[x]  NoRespiratory:Respiratory WDL: WDLGastrointestinal: GI WDL: WDLGenitourinary: Genitourinary WDL: WDLElimination:   [x]  Independent	[]  Commode	[]  Bedpan/Urinal  []  Straight Cath		   []  Foley cath 	[]   Urostomy	[]  Colostomy	Other (specify):Skin Alteration: []  Pressure Injury []  Wound []  None Ambulation:     [x]  Independent []  Cane   []  Walker	[]  Wheelchair       	               []  Bedbound     []  Hemiplegic []  Paraplegic []  Quadraplegic Dietary Orders (From admission, onward)     Start     Ordered  08/22/22 1803  Diet Cardiac  DIET EFFECTIVE NOW      Question Answer Comment Sodium Restriction: 3 gm NA  Initiate Nutrition Management Protocol (Yes/No?) Yes - Initiate Protocol    08/22/22 1802     Medication Administration Special Directions:  ZOSYN INFUSING THROUGH IVIV Access: [x]  PIV   []  PICC    []  Port    [] Central line    []  A-line    Other (specify)Outstanding Meds/Treatments/Tests: PET SCANSafety Precautions: [x]  None	[]  Sitter   []  Restraints	[]  Suicidal                      	  []  Fall Risk	 Other (specify):TRANSFER INFORMATION Patient Belongings: I have reviewed patient valuables Belongings charted in last 7 days:Valuable(s) : Cell Phone; [employer] fast streamer; Laptop; Other (Comments); Hearing aid (08/25/2022 11:00 AM) Is someone taking belongings home?   []  No     []  Yes  Who? (specify) _________Transferred to 5-2 (unit) for Change in condition/acuity .Equipment/Device: IV pumpEscorted UU:[id number] NurseVitals:  08/25/22 0802 08/25/22 1000 08/25/22 1200 08/25/22 1210 BP:  114/68  130/63 Pulse:  68  66 Resp:    20 Temp: 97.3 ?F (36.3 ?C)  97.4 ?F (36.3 ?C)  TempSrc: Oral    SpO2:    98% Weight:     Height:      Verbal report given to 5-2 RN  (name) Report considered of patient's Situation, Background, Assessment, and RecommendationOpportunity for questions and clarification was providedRN and Contact number/M[facility] #:                                   Nat Math, [phone]

## 2022-08-25 NOTE — Plan of Care
Plan of Care Overview/ Patient Status    Cameron Zimmerman is a 88 y.o. male who was admitted on 08/21/2022.Surgeries this admission: No admission procedures for hospital encounter. No admission procedures for hospital encounter. Past Medical History: Diagnosis Date  CKD (chronic kidney disease) August 2012  Stage 3  Coronary artery disease   Diverticulosis   H/O gastroesophageal reflux (GERD)   no longer requires medication  History of CEA (carotid endarterectomy) February 2006  L side  HLD (hyperlipidemia)   HTN (hypertension)   S/P AAA (abdominal aortic aneurysm) repair Repair Nov 2008  Sciatica   TIA (transient ischemic attack) November 2008  possible Neuro/Psych: Pt is alert and oriented x4, able to make needs known.Mobility: Ambulates independently. Turns and repositions independently, instructed to change positions q2h to prevent skin breakdown.Safety: Educated on use of call light, call light and personal items placed within reach.  Bed in low locked position. Calls for help as needed. Respiratory: Lungs clear on room air.CV: 1st degree AVB on telemetry.  Denies chest pain.Skin: Intact. Tolerating diet: Yes, Pain controlled: Yes, Bowel function - flatus: Yes, Bowel movements: Yes, and Urination: Spontaneous voidingActive Lines   Active PICC Line / CVC Line / ART Line   None     Plan: PET scan Thursday to rule out endo leak, earlier if possible. Cleared for transfer to floor, order placed. Please see flowsheets for full assessments.   Problem: Adult Inpatient Plan of CareGoal: Plan of Care ReviewOutcome: Interventions implemented as appropriateGoal: Patient-Specific Goal (Individualized)Outcome: Interventions implemented as appropriateGoal: Absence of Hospital-Acquired Illness or InjuryOutcome: Interventions implemented as appropriateGoal: Optimal Comfort and WellbeingOutcome: Interventions implemented as appropriateGoal: Readiness for Transition of CareOutcome: Interventions implemented as appropriate Problem: Fall Injury RiskGoal: Absence of Fall and Fall-Related InjuryOutcome: Interventions implemented as appropriate Problem: Gastrointestinal BleedingGoal: Optimal Coping with Acute IllnessOutcome: Interventions implemented as appropriateGoal: HemostasisOutcome: Interventions implemented as appropriate

## 2022-08-25 NOTE — Plan of Care
Plan of Care Overview/ Patient Status    5-2 Nursing Note/Plan of CareDawn E Leronda Zimmerman, RNSummary:  Patient received as transfer in stable condition.  IV abx continued and plan for PET scan tomorrow morning.      Data  Temp:  [97.3 ?F (36.3 ?C)-97.6 ?F (36.4 ?C)] 97.6 ?F (36.4 ?C)Pulse:  [61-70] 70Resp:  [14-20] 14BP: (108-130)/(44-73) 117/73SpO2:  [97 %-100 %] 100 %Device (Oxygen Therapy): room airGross Totals (Last 24 hours) at 08/25/2022 1806Last data filed at 08/25/2022 1626Intake 1043.88 ml Output -- Net 1043.88 ml Wt Readings from Last 3 Encounters: 08/21/22 66.2 kg 08/21/22 67.1 kg 08/17/16 69.9 kg   Allergies:  Patient has no known allergies.   Dietary Orders (From admission, onward)     Start     Ordered  08/26/22 0001  Diet NPO Time Specified  DIET EFFECTIVE MIDNIGHT      Question Answer Comment NPO: Nothing enteral or oral except meds with sips of water  What is the reason for NPO status? Test or procedure  Initiate Nutrition Management Protocol (Yes/No?) Yes - Initiate Protocol    08/25/22 1313  08/22/22 1803  Diet Cardiac  DIET EFFECTIVE NOW      Question Answer Comment Sodium Restriction: 3 gm NA  Initiate Nutrition Management Protocol (Yes/No?) Yes - Initiate Protocol    08/22/22 1802     Telemetry--nurse review normal sinus rhythm, intact AV conductionPhysical assessment summary---see flow sheet for complete assessment Neuro: X 3CV: Denies palpitations and racing heartPulm: clearGI:  WDL GU:  none Spontaneous voidingMusculoskeletal: independentSkin:  negativePeripheral IV:  Peripheral Line nonePain: no  Nursing Care Plan Problem: Adult Inpatient Plan of CareGoal: Plan of Care ReviewOutcome: Interventions implemented as appropriateGoal: Patient-Specific Goal (Individualized)Outcome: Interventions implemented as appropriateGoal: Absence of Hospital-Acquired Illness or InjuryOutcome: Interventions implemented as appropriateGoal: Optimal Comfort and WellbeingOutcome: Interventions implemented as appropriateGoal: Readiness for Transition of CareOutcome: Interventions implemented as appropriate Problem: Fall Injury RiskGoal: Absence of Fall and Fall-Related InjuryOutcome: Interventions implemented as appropriate Problem: Gastrointestinal BleedingGoal: Optimal Coping with Acute IllnessOutcome: Interventions implemented as appropriateGoal: HemostasisOutcome: Interventions implemented as appropriate

## 2022-08-26 ENCOUNTER — Inpatient Hospital Stay: Admit: 2022-08-26 | Payer: MEDICARE

## 2022-08-26 DIAGNOSIS — T827XXA Infection and inflammatory reaction due to other cardiac and vascular devices, implants and grafts, initial encounter: Secondary | ICD-10-CM

## 2022-08-26 LAB — BASIC METABOLIC PANEL
BKR ANION GAP: 10 (ref 7–17)
BKR BLOOD UREA NITROGEN: 22 mg/dL (ref 8–23)
BKR BUN / CREAT RATIO: 11.1 (ref 8.0–23.0)
BKR CALCIUM: 9.5 mg/dL (ref 8.8–10.2)
BKR CHLORIDE: 110 mmol/L — ABNORMAL HIGH (ref 98–107)
BKR CO2: 20 mmol/L (ref 20–30)
BKR CREATININE: 1.99 mg/dL — ABNORMAL HIGH (ref 0.40–1.30)
BKR EGFR, CREATININE (CKD-EPI 2021): 32 mL/min/{1.73_m2} — ABNORMAL LOW (ref >=60)
BKR GLUCOSE: 85 mg/dL (ref 70–100)
BKR POTASSIUM: 4.2 mmol/L (ref 3.3–5.3)
BKR SODIUM: 140 mmol/L (ref 136–144)

## 2022-08-26 LAB — CBC WITH AUTO DIFFERENTIAL
BKR WAM ABSOLUTE IMMATURE GRANULOCYTES.: 0.06 x 1000/ÂµL (ref 0.00–0.30)
BKR WAM ABSOLUTE LYMPHOCYTE COUNT.: 1.56 x 1000/ÂµL (ref 0.60–3.70)
BKR WAM ABSOLUTE NRBC (2 DEC): 0 x 1000/ÂµL (ref 0.00–1.00)
BKR WAM ANALYZER ANC: 7.85 x 1000/ÂµL — ABNORMAL HIGH (ref 2.00–7.60)
BKR WAM BASOPHIL ABSOLUTE COUNT.: 0.07 x 1000/ÂµL (ref 0.00–1.00)
BKR WAM BASOPHILS: 0.6 % (ref 0.0–1.4)
BKR WAM EOSINOPHIL ABSOLUTE COUNT.: 0.4 x 1000/ÂµL (ref 0.00–1.00)
BKR WAM EOSINOPHILS: 3.7 % (ref 0.0–5.0)
BKR WAM HEMATOCRIT (2 DEC): 43.7 % (ref 38.50–50.00)
BKR WAM HEMOGLOBIN: 14.3 g/dL (ref 13.2–17.1)
BKR WAM IMMATURE GRANULOCYTES: 0.6 % (ref 0.0–1.0)
BKR WAM LYMPHOCYTES: 14.4 % — ABNORMAL LOW (ref 17.0–50.0)
BKR WAM MCH (PG): 33.8 pg — ABNORMAL HIGH (ref 27.0–33.0)
BKR WAM MCHC: 32.7 g/dL (ref 31.0–36.0)
BKR WAM MCV: 103.3 fL — ABNORMAL HIGH (ref 80.0–100.0)
BKR WAM MONOCYTE ABSOLUTE COUNT.: 0.9 x 1000/ÂµL (ref 0.00–1.00)
BKR WAM MONOCYTES: 8.3 % (ref 4.0–12.0)
BKR WAM MPV: 10.3 fL (ref 8.0–12.0)
BKR WAM NEUTROPHILS: 72.4 % — ABNORMAL HIGH (ref 39.0–72.0)
BKR WAM NUCLEATED RED BLOOD CELLS: 0 % (ref 0.0–1.0)
BKR WAM PLATELETS: 196 x1000/ÂµL (ref 150–420)
BKR WAM RDW-CV: 13.3 % (ref 11.0–15.0)
BKR WAM RED BLOOD CELL COUNT.: 4.23 M/ÂµL (ref 4.00–6.00)
BKR WAM WHITE BLOOD CELL COUNT: 10.8 x1000/ÂµL (ref 4.0–11.0)

## 2022-08-26 LAB — PHOSPHORUS     (BH GH L LMW YH): BKR PHOSPHORUS: 2.8 mg/dL (ref 2.2–4.5)

## 2022-08-26 LAB — MAGNESIUM: BKR MAGNESIUM: 2.1 mg/dL (ref 1.7–2.4)

## 2022-08-26 LAB — VANCOMYCIN, RANDOM     (BH GH LMW YH)
BKR VANCOMYCIN RANDOM: 8.1 ug/mL
BKR VANCOMYCIN RANDOM: 17.3 ug/mL — ABNORMAL HIGH

## 2022-08-26 MED ORDER — VANCOMYCIN 1 G IN 250 ML IVPB (VIALMATE)
INTRAVENOUS | Status: DC
Start: 2022-08-26 — End: 2022-08-26

## 2022-08-26 MED ORDER — FLUDEOXYGLUCOSE P/DOSE (FDG) INJECTION
Freq: Once | INTRAVENOUS | Status: CP | PRN
Start: 2022-08-26 — End: ?
  Administered 2022-08-26: 13:00:00 via INTRAVENOUS

## 2022-08-26 NOTE — Plan of Care
Plan of Care Overview/ Patient Status    5-2 Nursing Note/Plan of CareDawn E Talicia Zimmerman, RNSummary:  Patient stable, had PET scan this morning, continues on IV abx.      Data  Temp:  [97.3 ?F (36.3 ?C)-98 ?F (36.7 ?C)] 98 ?F (36.7 ?C)Pulse:  [60-74] 62Resp:  [18-19] 18BP: (115-131)/(72-80) 115/72SpO2:  [96 %-100 %] 98 %Device (Oxygen Therapy): room airGross Totals (Last 24 hours) at 08/26/2022 1723Last data filed at 08/26/2022 1710Intake 201.79 ml Output 550 ml Net -348.21 ml Wt Readings from Last 3 Encounters: 08/21/22 66.2 kg 08/21/22 67.1 kg 08/17/16 69.9 kg   Allergies:  Patient has no known allergies.   Dietary Orders (From admission, onward)     Start     Ordered  08/26/22 1051  Diet Regular  DIET EFFECTIVE NOW      Question:  Initiate Nutrition Management Protocol (Yes/No?)  Answer:  Yes - Initiate Protocol  08/26/22 1050     Telemetry--nurse review normal sinus rhythm, intact 1 AVB, PACs, PVCs, and some episodes of bradycardia down to 38Physical assessment summary---see flow sheet for complete assessment Neuro: X 3CV: Denies palpitations and racing heartPulm: clearGI:  WDL GU:  none Spontaneous voidingMusculoskeletal: independentSkin:  negativePeripheral IV:  Peripheral Line nonePain: no  Nursing Care Plan Problem: Adult Inpatient Plan of CareGoal: Plan of Care ReviewOutcome: Interventions implemented as appropriateGoal: Patient-Specific Goal (Individualized)Outcome: Interventions implemented as appropriateGoal: Absence of Hospital-Acquired Illness or InjuryOutcome: Interventions implemented as appropriateGoal: Optimal Comfort and WellbeingOutcome: Interventions implemented as appropriateGoal: Readiness for Transition of CareOutcome: Interventions implemented as appropriate Problem: Fall Injury RiskGoal: Absence of Fall and Fall-Related InjuryOutcome: Interventions implemented as appropriate Problem: Gastrointestinal BleedingGoal: Optimal Coping with Acute IllnessOutcome: Interventions implemented as appropriateGoal: HemostasisOutcome: Interventions implemented as appropriate

## 2022-08-26 NOTE — Plan of Care
Plan of Care Overview/ Patient Status    AxO x4. VSS. Lung sounds diminished on RA. NSR/1AVB on tele, denies any chest pain, SOB, palpitations. No complaints of any stomach pain throughout shift. Continues on IV Vanco and zosyn. NPO at midnight for PET in AM. Urinal provided for overnight use. OOB independently. Problem: Adult Inpatient Plan of CareGoal: Plan of Care ReviewOutcome: Interventions implemented as appropriateGoal: Patient-Specific Goal (Individualized)Outcome: Interventions implemented as appropriateGoal: Absence of Hospital-Acquired Illness or InjuryOutcome: Interventions implemented as appropriateGoal: Optimal Comfort and WellbeingOutcome: Interventions implemented as appropriateGoal: Readiness for Transition of CareOutcome: Interventions implemented as appropriate Problem: Fall Injury RiskGoal: Absence of Fall and Fall-Related InjuryOutcome: Interventions implemented as appropriate Problem: Gastrointestinal BleedingGoal: Optimal Coping with Acute IllnessOutcome: Interventions implemented as appropriateGoal: HemostasisOutcome: Interventions implemented as appropriate

## 2022-08-26 NOTE — Pharmacy Discharge Note
VANCOMYCIN LEVEL EVALUATIONCurrent Vancomycin Order: 1 g IV per laboratory/placeholder data. Day of Therapy: 4Vancomycin Indication: Suspected MRSA infection, suspected graft infectionEstimated Creatinine Clearance: 22 mL/min (A) (by C-G formula based on SCr of 2.06 mg/dL (H)).Creatinine (mg/dL) Date Value 98/09/9146 2.06 (H) 08/24/2022 1.97 (H) 08/23/2022 1.78 (H)  Renal Function: AKILevel: Lab Results Last 72 Hours Component Value Date/Time  Vancomycin Random 8.1 08/25/2022 09:56 PM Type of Level: Random ~ 48 hrs post doseBased on vancomycin level obtained, recommend:  Re-dose with vancomycin 1 g IV x 1 dose Repeat Level: VR 9/28 @ 1100 (~ 36 hrs post dose)ID/AST Consulted? No [facility] Vancomycin Dosing GuidelineBH Vancomycin Dosing GuidelineFor questions, please contact the pharmacist: Albin Felling, PharmD    Phone/Mobile Heartbeat: [facility]

## 2022-08-26 NOTE — Plan of Care
Patient Name:Cameron Zimmerman					             MRN: [id number] Chaplain:  [id number]				                                      					 [facility]YscSpiritual Care Note                  **Date of Spiritual Visit: 08/25/22          Start Time: 1102    Stop Time: 1115  Time Calculation (min): 13 min       Total Consult Time: 20 minutes  Assessment:  Met with Cameron Zimmerman (on 9/26) who seemed to be in good spirits as he awaits a procedure to determine the next course of action. Cameron Zimmerman normally lives in [place], but came to [place] where four of his five children are so that he would have plenty of support. He shared that his wife died two years ago and how big of an adjustment that has been after over 50 years of marriage. Cameron Zimmerman shared that he enjoys golfing (he had his clubs shipped here) and reading Alycia Rossetti. He voiced appreciation for the additional support and is open to future visits. Intervention:  Referral Source: Chaplain Initiated        Intervention Type: Spiritual Visit, Initial Assessment Spiritual/Emotional/Religious Care Intervention(s) provided: Active Listening and or Reflection, Facilitation of Theological/Spiritual Reflection/Meaning Making, Introduction to [employer], Companionship, Empathy  Outcome: Patient/Loved ones' desires are respected; emotional spiritual, religious and/or existential needs are addressed.With the help of the Chaplain, Patient/Loved One(s): Felt greater comfort and support, Identified relational resources, Was/were better able to use spiritual and/or emotional resources and practices for resiliency and/or to adjust, Expressed emotions for catharsis, Established a relationship with the chaplain, Expressed being more at peace  Plan: Spiritual Care is available 24/7 for support of patients, their loved ones, and staff. Please consult spiritual care in Epic for non-urgent needs or call our on-call chaplain for urgent needs. Below is a list of common reasons to consult [place] for support:  N: new diagnosis E: emotional/spiritual distress E: existential distress D: decision making/goals of care meetingsS: support for staff and patients' loved ones   C: compromised coping/codeA: anxiety/stress/grief/loneliness R: religious/cultural/ritual needsE: end-of-life care/death/dying Chaplain Lindwood Coke (She/Her)MHB: [phone] 8am-4:30pmYSC On-call Chaplain: [phone]/27/2023 9:33 AM

## 2022-08-27 ENCOUNTER — Encounter: Admit: 2022-08-27 | Payer: PRIVATE HEALTH INSURANCE

## 2022-08-27 LAB — BASIC METABOLIC PANEL
BKR ANION GAP: 11 (ref 7–17)
BKR BLOOD UREA NITROGEN: 23 mg/dL (ref 8–23)
BKR BUN / CREAT RATIO: 11 (ref 8.0–23.0)
BKR CALCIUM: 9.3 mg/dL (ref 8.8–10.2)
BKR CHLORIDE: 109 mmol/L — ABNORMAL HIGH (ref 98–107)
BKR CO2: 22 mmol/L (ref 20–30)
BKR CREATININE: 2.1 mg/dL — ABNORMAL HIGH (ref 0.40–1.30)
BKR EGFR, CREATININE (CKD-EPI 2021): 30 mL/min/{1.73_m2} — ABNORMAL LOW (ref >=60)
BKR GLUCOSE: 82 mg/dL (ref 70–100)
BKR POTASSIUM: 4.6 mmol/L (ref 3.3–5.3)
BKR SODIUM: 142 mmol/L (ref 136–144)

## 2022-08-27 LAB — CBC WITH AUTO DIFFERENTIAL
BKR WAM ABSOLUTE IMMATURE GRANULOCYTES.: 0.05 x 1000/ÂµL (ref 0.00–0.30)
BKR WAM ABSOLUTE LYMPHOCYTE COUNT.: 1.76 x 1000/ÂµL (ref 0.60–3.70)
BKR WAM ABSOLUTE NRBC (2 DEC): 0 x 1000/ÂµL (ref 0.00–1.00)
BKR WAM ANALYZER ANC: 7.98 x 1000/ÂµL — ABNORMAL HIGH (ref 2.00–7.60)
BKR WAM BASOPHIL ABSOLUTE COUNT.: 0.06 x 1000/ÂµL (ref 0.00–1.00)
BKR WAM BASOPHILS: 0.5 % (ref 0.0–1.4)
BKR WAM EOSINOPHIL ABSOLUTE COUNT.: 0.35 x 1000/ÂµL (ref 0.00–1.00)
BKR WAM EOSINOPHILS: 3.2 % (ref 0.0–5.0)
BKR WAM HEMATOCRIT (2 DEC): 42.8 % (ref 38.50–50.00)
BKR WAM HEMOGLOBIN: 14 g/dL (ref 13.2–17.1)
BKR WAM IMMATURE GRANULOCYTES: 0.5 % (ref 0.0–1.0)
BKR WAM LYMPHOCYTES: 15.9 % — ABNORMAL LOW (ref 17.0–50.0)
BKR WAM MCH (PG): 34.1 pg — ABNORMAL HIGH (ref 27.0–33.0)
BKR WAM MCHC: 32.7 g/dL (ref 31.0–36.0)
BKR WAM MCV: 104.4 fL — ABNORMAL HIGH (ref 80.0–100.0)
BKR WAM MONOCYTE ABSOLUTE COUNT.: 0.85 x 1000/ÂµL (ref 0.00–1.00)
BKR WAM MONOCYTES: 7.7 % (ref 4.0–12.0)
BKR WAM MPV: 10.3 fL (ref 8.0–12.0)
BKR WAM NEUTROPHILS: 72.2 % — ABNORMAL HIGH (ref 39.0–72.0)
BKR WAM NUCLEATED RED BLOOD CELLS: 0 % (ref 0.0–1.0)
BKR WAM PLATELETS: 200 x1000/ÂµL (ref 150–420)
BKR WAM RDW-CV: 13.3 % (ref 11.0–15.0)
BKR WAM RED BLOOD CELL COUNT.: 4.1 M/ÂµL (ref 4.00–6.00)
BKR WAM WHITE BLOOD CELL COUNT: 11.1 x1000/ÂµL — ABNORMAL HIGH (ref 4.0–11.0)

## 2022-08-27 LAB — PHOSPHORUS     (BH GH L LMW YH): BKR PHOSPHORUS: 3.2 mg/dL (ref 2.2–4.5)

## 2022-08-27 LAB — MAGNESIUM: BKR MAGNESIUM: 2 mg/dL (ref 1.7–2.4)

## 2022-08-27 LAB — VANCOMYCIN, RANDOM     (BH GH LMW YH): BKR VANCOMYCIN RANDOM: 9.2 ug/mL

## 2022-08-27 MED ORDER — VANCOMYCIN 1 G IN 250 ML IVPB (VIALMATE)
Freq: Once | INTRAVENOUS | Status: CP
Start: 2022-08-27 — End: ?
  Administered 2022-08-27: 19:00:00 250.000 mL/h via INTRAVENOUS

## 2022-08-27 MED ORDER — VANCOMYCIN MAR LEVEL
Freq: Once | INTRAVENOUS | Status: DC
Start: 2022-08-27 — End: 2022-08-28

## 2022-08-27 NOTE — Plan of Care
Plan of Care Overview/ Patient Status    5-2 Nursing Note/Plan of CareDawn E Briseyda Fehr, RNSummary:  Patient continues on IV abx, a couple of bradycardic episodes down to 35-38.      Data  Temp:  [97.8 ?F (36.6 ?C)-98.7 ?F (37.1 ?C)] 98.7 ?F (37.1 ?C)Pulse:  [62-68] 65Resp:  [18] 18BP: (111-128)/(67-75) 111/67SpO2:  [98 %-99 %] 99 %Device (Oxygen Therapy): room airGross Totals (Last 24 hours) at 08/27/2022 1612Last data filed at 08/27/2022 1538Intake 816.37 ml Output 0 ml Net 816.37 ml Wt Readings from Last 3 Encounters: 08/21/22 66.2 kg 08/21/22 67.1 kg 08/17/16 69.9 kg   Allergies:  Patient has no known allergies.   Dietary Orders (From admission, onward)     Start     Ordered  08/26/22 1051  Diet Regular  DIET EFFECTIVE NOW      Question:  Initiate Nutrition Management Protocol (Yes/No?)  Answer:  Yes - Initiate Protocol  08/26/22 1050     Telemetry--nurse review normal sinus rhythm, intact 1 AVB and PVCsPhysical assessment summary---see flow sheet for complete assessment Neuro: X 3CV: Denies palpitations and racing heartPulm: clearGI:  WDL GU:  none Spontaneous voidingMusculoskeletal: independentSkin:  negativePeripheral IV:  Peripheral Line Pain: no  Nursing Care Plan Problem: Adult Inpatient Plan of CareGoal: Plan of Care ReviewOutcome: Interventions implemented as appropriateGoal: Patient-Specific Goal (Individualized)Outcome: Interventions implemented as appropriateGoal: Absence of Hospital-Acquired Illness or InjuryOutcome: Interventions implemented as appropriateGoal: Optimal Comfort and WellbeingOutcome: Interventions implemented as appropriateGoal: Readiness for Transition of CareOutcome: Interventions implemented as appropriate Problem: Fall Injury RiskGoal: Absence of Fall and Fall-Related InjuryOutcome: Interventions implemented as appropriate Problem: Gastrointestinal BleedingGoal: Optimal Coping with Acute IllnessOutcome: Interventions implemented as appropriateGoal: HemostasisOutcome: Interventions implemented as appropriate

## 2022-08-27 NOTE — Plan of Care
Plan of Care Overview/ Patient Status    AxO x4. VSS. Lung sounds clear on RA. NSR/SB/1AVB on tele, HR 50-60s, denies any chest pain, SOB, palpitations. Continues on IV abx. No complaints of any abdominal pain overnight. Voids spontaneously in urinal. OOB independently. Problem: Adult Inpatient Plan of CareGoal: Plan of Care ReviewOutcome: Interventions implemented as appropriateGoal: Patient-Specific Goal (Individualized)Outcome: Interventions implemented as appropriateGoal: Absence of Hospital-Acquired Illness or InjuryOutcome: Interventions implemented as appropriateGoal: Optimal Comfort and WellbeingOutcome: Interventions implemented as appropriateGoal: Readiness for Transition of CareOutcome: Interventions implemented as appropriate Problem: Fall Injury RiskGoal: Absence of Fall and Fall-Related InjuryOutcome: Interventions implemented as appropriate Problem: Gastrointestinal BleedingGoal: Optimal Coping with Acute IllnessOutcome: Interventions implemented as appropriateGoal: HemostasisOutcome: Interventions implemented as appropriate

## 2022-08-28 ENCOUNTER — Encounter: Admit: 2022-08-28 | Payer: PRIVATE HEALTH INSURANCE | Attending: Vascular Surgery

## 2022-08-28 DIAGNOSIS — I878 Other specified disorders of veins: Secondary | ICD-10-CM

## 2022-08-28 DIAGNOSIS — I129 Hypertensive chronic kidney disease with stage 1 through stage 4 chronic kidney disease, or unspecified chronic kidney disease: Secondary | ICD-10-CM

## 2022-08-28 DIAGNOSIS — Z79899 Other long term (current) drug therapy: Secondary | ICD-10-CM

## 2022-08-28 DIAGNOSIS — Z955 Presence of coronary angioplasty implant and graft: Secondary | ICD-10-CM

## 2022-08-28 DIAGNOSIS — K5733 Diverticulitis of large intestine without perforation or abscess with bleeding: Secondary | ICD-10-CM

## 2022-08-28 DIAGNOSIS — E785 Hyperlipidemia, unspecified: Secondary | ICD-10-CM

## 2022-08-28 DIAGNOSIS — N183 Chronic kidney disease, stage 3 unspecified: Secondary | ICD-10-CM

## 2022-08-28 DIAGNOSIS — I739 Peripheral vascular disease, unspecified: Secondary | ICD-10-CM

## 2022-08-28 DIAGNOSIS — Z7982 Long term (current) use of aspirin: Secondary | ICD-10-CM

## 2022-08-28 DIAGNOSIS — Z8673 Personal history of transient ischemic attack (TIA), and cerebral infarction without residual deficits: Secondary | ICD-10-CM

## 2022-08-28 DIAGNOSIS — K219 Gastro-esophageal reflux disease without esophagitis: Secondary | ICD-10-CM

## 2022-08-28 DIAGNOSIS — Z9889 Other specified postprocedural states: Secondary | ICD-10-CM

## 2022-08-28 DIAGNOSIS — Z7902 Long term (current) use of antithrombotics/antiplatelets: Secondary | ICD-10-CM

## 2022-08-28 DIAGNOSIS — Z8546 Personal history of malignant neoplasm of prostate: Secondary | ICD-10-CM

## 2022-08-28 DIAGNOSIS — M5430 Sciatica, unspecified side: Secondary | ICD-10-CM

## 2022-08-28 DIAGNOSIS — I2510 Atherosclerotic heart disease of native coronary artery without angina pectoris: Secondary | ICD-10-CM

## 2022-08-28 DIAGNOSIS — T827XXA Infection and inflammatory reaction due to other cardiac and vascular devices, implants and grafts, initial encounter: Secondary | ICD-10-CM

## 2022-08-28 LAB — BASIC METABOLIC PANEL
BKR ANION GAP: 11 (ref 7–17)
BKR BLOOD UREA NITROGEN: 24 mg/dL — ABNORMAL HIGH (ref 8–23)
BKR BUN / CREAT RATIO: 12.2 (ref 8.0–23.0)
BKR CALCIUM: 9.1 mg/dL (ref 8.8–10.2)
BKR CHLORIDE: 112 mmol/L — ABNORMAL HIGH (ref 98–107)
BKR CO2: 20 mmol/L (ref 20–30)
BKR CREATININE: 1.97 mg/dL — ABNORMAL HIGH (ref 0.40–1.30)
BKR EGFR, CREATININE (CKD-EPI 2021): 32 mL/min/{1.73_m2} — ABNORMAL LOW (ref >=60)
BKR GLUCOSE: 86 mg/dL (ref 70–100)
BKR POTASSIUM: 4.4 mmol/L (ref 3.3–5.3)
BKR SODIUM: 143 mmol/L (ref 136–144)

## 2022-08-28 LAB — CBC WITH AUTO DIFFERENTIAL
BKR WAM ABSOLUTE IMMATURE GRANULOCYTES.: 0.02 x 1000/ÂµL (ref 0.00–0.30)
BKR WAM ABSOLUTE LYMPHOCYTE COUNT.: 1.61 x 1000/ÂµL (ref 0.60–3.70)
BKR WAM ABSOLUTE NRBC (2 DEC): 0 x 1000/ÂµL (ref 0.00–1.00)
BKR WAM ANALYZER ANC: 4.62 x 1000/ÂµL (ref 2.00–7.60)
BKR WAM BASOPHIL ABSOLUTE COUNT.: 0.08 x 1000/ÂµL (ref 0.00–1.00)
BKR WAM BASOPHILS: 1.1 % (ref 0.0–1.4)
BKR WAM EOSINOPHIL ABSOLUTE COUNT.: 0.42 x 1000/ÂµL (ref 0.00–1.00)
BKR WAM EOSINOPHILS: 5.7 % — ABNORMAL HIGH (ref 0.0–5.0)
BKR WAM HEMATOCRIT (2 DEC): 43.5 % (ref 38.50–50.00)
BKR WAM HEMOGLOBIN: 13.8 g/dL (ref 13.2–17.1)
BKR WAM IMMATURE GRANULOCYTES: 0.3 % (ref 0.0–1.0)
BKR WAM LYMPHOCYTES: 21.7 % (ref 17.0–50.0)
BKR WAM MCH (PG): 33.3 pg — ABNORMAL HIGH (ref 27.0–33.0)
BKR WAM MCHC: 31.7 g/dL (ref 31.0–36.0)
BKR WAM MCV: 104.8 fL — ABNORMAL HIGH (ref 80.0–100.0)
BKR WAM MONOCYTE ABSOLUTE COUNT.: 0.66 x 1000/ÂµL (ref 0.00–1.00)
BKR WAM MONOCYTES: 8.9 % (ref 4.0–12.0)
BKR WAM MPV: 10.7 fL (ref 8.0–12.0)
BKR WAM NEUTROPHILS: 62.3 % (ref 39.0–72.0)
BKR WAM NUCLEATED RED BLOOD CELLS: 0 % (ref 0.0–1.0)
BKR WAM PLATELETS: 189 x1000/ÂµL (ref 150–420)
BKR WAM RDW-CV: 13.2 % (ref 11.0–15.0)
BKR WAM RED BLOOD CELL COUNT.: 4.15 M/ÂµL (ref 4.00–6.00)
BKR WAM WHITE BLOOD CELL COUNT: 7.4 x1000/ÂµL (ref 4.0–11.0)

## 2022-08-28 LAB — PHOSPHORUS     (BH GH L LMW YH): BKR PHOSPHORUS: 2.9 mg/dL (ref 2.2–4.5)

## 2022-08-28 LAB — MAGNESIUM: BKR MAGNESIUM: 2.2 mg/dL (ref 1.7–2.4)

## 2022-08-28 MED ORDER — ROSUVASTATIN 20 MG TABLET
20 mg | ORAL_TABLET | Freq: Every evening | ORAL | 12 refills | Status: AC
Start: 2022-08-28 — End: 2022-08-28

## 2022-08-28 MED ORDER — ACETAMINOPHEN 325 MG TABLET
325 mg | ORAL_TABLET | Freq: Four times a day (QID) | ORAL | 12 refills | Status: AC | PRN
Start: 2022-08-28 — End: ?

## 2022-08-28 MED ORDER — ATORVASTATIN 40 MG TABLET
40 mg | ORAL_TABLET | Freq: Every day | ORAL | 2 refills | Status: AC
Start: 2022-08-28 — End: ?

## 2022-08-28 MED ORDER — ACETAMINOPHEN 325 MG TABLET
325 mg | ORAL_TABLET | Freq: Four times a day (QID) | ORAL | 12 refills | Status: AC | PRN
Start: 2022-08-28 — End: 2022-08-28

## 2022-08-28 MED ORDER — AMOXICILLIN 500 MG-POTASSIUM CLAVULANATE 125 MG TABLET
500-125 mg | ORAL_TABLET | Freq: Two times a day (BID) | ORAL | 12 refills | Status: AC
Start: 2022-08-28 — End: ?

## 2022-08-28 MED ORDER — AMOXICILLIN 500 MG-POTASSIUM CLAVULANATE 125 MG TABLET
500-125 mg | Freq: Two times a day (BID) | ORAL | Status: DC
Start: 2022-08-28 — End: 2022-08-29
  Administered 2022-08-28: 14:00:00 500-125 mg via ORAL

## 2022-08-28 MED ORDER — CHOLECALCIFEROL (VITAMIN D3) 25 MCG (1,000 UNIT) TABLET
25 mcg (1,000 unit) | ORAL | Status: AC
Start: 2022-08-28 — End: ?

## 2022-08-28 MED ORDER — CAMPHOR-MENTHOL 0.5 %-0.5 % LOTION
Freq: Two times a day (BID) | TOPICAL | Status: DC | PRN
Start: 2022-08-28 — End: 2022-08-29
  Administered 2022-08-28: 13:00:00 via TOPICAL

## 2022-08-28 NOTE — Plan of Care
Plan of Care Overview/ Patient Status    Pt aox4 NSR w/ 1AVB and PVCs on tele. No CP or palpitations. HR 60s; SBP 120s-150s. Lung sounds clear on RA w/o SOB. LBM 9/28- reported normal w/o looseness or dark/bloody complexion. Voids spontaneously in bedside urinal. Pulses palpable. Skin c/d/i; discoloration from scar tissue on LLE from previous surgery. Complained of back itchiness- Sarna lotion applied and tolerated well. Ambulates and repositions independently. Transitioned from IV antibiotics to PO Augmentin. Plan: dc todayProblem: Adult Inpatient Plan of CareGoal: Plan of Care ReviewOutcome: Interventions implemented as appropriateGoal: Patient-Specific Goal (Individualized)Outcome: Interventions implemented as appropriate

## 2022-08-28 NOTE — Discharge Instructions
You may shower.Take 5 short walks daily.No driving while taking narcotic pain medications. Call MD for temperature > 101, loss of appetite, unusual fatigue, chest pain and shortness of breath.You need a colonoscopy outpatient

## 2022-08-28 NOTE — Plan of Care
Plan of Care Overview/ Patient Status    Demetria Pore, RN BSNCardiology Care CoordinatorTelephone: [phone] Heart Beat:  [phone] Management Plan  Flowsheet Row Most Recent Value CM D/C Readiness  PASRR completed and approved N/A Authorization number obtained, if required N/A Is there a 3 day INPATIENT Qualifying stay for Medicare Patients? N/A Medicare IM- signed, dated, timed and scanned, if required Yes  [TC provided] DME Authorized/Delivered N/A No needs identified/ follow up with PCP/MD Yes Post acute care services secured W10 complete N/A Pri Completed and Accepted  N/A Is the destination address correct on the W10 N/A Finalized Plan  Expected Discharge Date 08/29/22

## 2022-08-28 NOTE — Plan of Care
Plan of Care Overview/ Patient Status    1900-2300Aox4. RA. SB- SR 1avb on tele. HR 50 -60's. Denies CP SOB palpitations. No complaints of abdominal discomfort. VSS. OOB independently. On IV abx. Continue POC,Problem: Adult Inpatient Plan of CareGoal: Plan of Care ReviewOutcome: Interventions implemented as appropriate Problem: Adult Inpatient Plan of CareGoal: Absence of Hospital-Acquired Illness or InjuryOutcome: Interventions implemented as appropriate Problem: Fall Injury RiskGoal: Absence of Fall and Fall-Related InjuryOutcome: Interventions implemented as appropriate Problem: Gastrointestinal BleedingGoal: Optimal Coping with Acute IllnessOutcome: Interventions implemented as appropriate Problem: Gastrointestinal BleedingGoal: Optimal Coping with Acute IllnessOutcome: Interventions implemented as appropriate Problem: Gastrointestinal BleedingGoal: HemostasisOutcome: Interventions implemented as appropriate

## 2022-08-28 NOTE — Care Coordination-Inpatient
I identified my role as TC from Care Management department. Explained Medicare Important Message to patient . Patient  stated understanding the notice and was in agreement with the discharge plan. Patient advised of the right to call the [employer] [facility]) at [phone] if not in agreement with the discharge from the hospital.  Copy mailed/routed to MyChart  to patient representative after confirming address.Bethann Berkshire MesidorTransition CoordinatorCare Management YNHHPhone [phone] [phone]

## 2022-08-28 NOTE — Progress Notes
[facility]   Vascular Surgery Progress NoteAttending Provider: Wadie Lessen, MDAdmit Date: 9/22/2023Hospital Day: 8Code status: Full CodeAllergy: Patient has no known allergies.Background:  Cameron Zimmerman is a 88 y.o. male w/ PMHx of low grade prostate ca, CKD, CAD s/p PCI with stents 2013, Diverticulosis with prior GI bleeds, GERD, TIA, L CEA, HLD, HTN, and AAA s/p open repair in 2008 p/w LGIB with c/f graft infectionSubjective/Interm Events: - NAEON- HDS, AVSS- PET [place] concerning for graft infection and early acute diverticulitis- Reviewed by Dr Dimple Casey with current plan for medical mgmt with antibiotics- On vanc + zosyn (day 7)Objective: Temp:  [97.4 ?F (36.3 ?C)-98.7 ?F (37.1 ?C)] 97.4 ?F (36.3 ?C)Pulse:  [57-68] 57Resp:  [18] 18BP: (110-134)/(64-77) 110/64SpO2:  [97 %-99 %] 97 %Device (Oxygen Therapy): room airI/O last 3 completed shifts:In: 530.4 [P.O.:236; IV Piggyback:294.4]Out: - CBCLab Results Component Value Date  WBC 7.4 08/28/2022  HGB 13.8 08/28/2022  HCT 43.50 08/28/2022  PLT 189 08/28/2022 LYTESLab Results Component Value Date  NA 143 08/28/2022  K 4.4 08/28/2022  CL 112 (H) 08/28/2022  CO2 20 08/28/2022  CREATININE 1.97 (H) 08/28/2022  BUN 24 (H) 08/28/2022  GLU 86 08/28/2022  CALCIUM 9.1 08/28/2022  MG 2.2 08/28/2022  PHOS 2.9 08/28/2022 LFTsLab Results Component Value Date  BILITOT 0.5 08/21/2022  AST 21 08/21/2022  ALT 22 08/21/2022  ALKPHOS 81 08/21/2022 PT/INR/PTTLab Results Component Value Date  INR 1.02 08/21/2022  PTT 24.5 06/13/2013 Microbiology:Lab Results Component Value Date  LABBLOO No Growth to Date 08/26/2022  LABBLOO No Growth to Date 08/26/2022 PHYSICAL EXAMGen: in bed, NADCV: RRPulm: breathing comfortably on RAAbd: soft, minimal lower abdominal tendernessExt: wwpAssessment Cameron Zimmerman is a 88 y.o. male, Hospital Day: 8 with concern for possible aortic graft infection, noted on PET [place] at the aortic bifurcation and extending along the right iliac limbPlan - Regular diet- Switch to PO abx- Patient advised he would need oral antibiotics, likely for life- Discharge home today with out patient follow up with Dr Dimple Casey in 4-6 weeksNotifications : For questions, please contact vascular teamSigned:Freeland Pracht E Ramia Zimmerman, MBBS9/29/2023 6:30 AMAttending Addendum:

## 2022-08-28 NOTE — Progress Notes
[facility]   Surgery Progress NoteAttending Provider: Wadie Lessen, MDAdmit Date: 9/22/2023Hospital Day: 5Code status: Full CodeAllergy: Patient has no known allergies.Background:  Cameron Zimmerman is a 88 y.o. male w/ PMHx of low grade prostate ca, CKD, CAD s/p PCI with stents 2013, Diverticulosis with prior GI bleeds, GERD, TIA, L CEA, HLD, HTN, and AAA s/p open repair in 2008 p/w LGIB with c/f graft infectionSubjective/Interm Events: - NAEON- HDS, AVSS- C/o minimal abdo pain- No bloody stools- PET [place] now planned for Wednesday 9/27- On vanc + zosyn (day 4)Objective: Temp:  [97.3 ?F (36.3 ?C)-98.2 ?F (36.8 ?C)] 97.4 ?F (36.3 ?C)Pulse:  [57-70] 66Resp:  [18-20] 20BP: (108-133)/(44-82) 130/63SpO2:  [97 %-99 %] 98 %Device (Oxygen Therapy): room airI/O last 3 completed shifts:In: 658.7 [P.O.:240; IV Piggyback:418.7]Out: - CBCLab Results Component Value Date  WBC 12.4 (H) 08/25/2022  HGB 14.5 08/25/2022  HCT 42.50 08/25/2022  PLT 201 08/25/2022 LYTESLab Results Component Value Date  NA 142 08/25/2022  K 4.3 08/25/2022  CL 108 (H) 08/25/2022  CO2 20 08/25/2022  CREATININE 2.06 (H) 08/25/2022  BUN 26 (H) 08/25/2022  GLU 93 08/25/2022  CALCIUM 9.3 08/25/2022  MG 2.3 08/25/2022  PHOS 3.9 08/25/2022 LFTsLab Results Component Value Date  BILITOT 0.5 08/21/2022  AST 21 08/21/2022  ALT 22 08/21/2022  ALKPHOS 81 08/21/2022 PT/INR/PTTLab Results Component Value Date  INR 1.02 08/21/2022  PTT 24.5 06/13/2013 Microbiology:No results found for: LABBLOO, LABURIN, LOWERRESPIRAPHYSICAL EXAMGen: in bed, NADCV: RRPulm: breathing comfortably on RAAbd: soft, minimal lower abdominal tendernessExt: wwpAssessment Cameron Zimmerman is a 88 y.o. male, Hospital Day: 5, being evaluated for LGIB, with concern for possible graft infectionPlan - PET [place] tomorrow- NPO from midnight- Review after PET [place]- Cont abxNotifications : For questions, please contact vascular teamSigned:Hoang Pettingill E Chantz Montef[place], MBBS9/26/2023 6:30 AMAttending Addendum:

## 2022-08-28 NOTE — Discharge Summary
[facility] V[facility] CenterMedical/Surgical Discharge SummaryPatient Data:  Patient Name: Cameron Zimmerman Admit date: 08/21/2022 Age: 88 y.o. Discharge date: 08/28/2022 DOB: Jan 14, 1934	 Discharge Attending Physician: Wadie Lessen, MD  MRN: [id number]	 Discharged Condition: stable PCP: Lafe Garin, MD Disposition: Home  Primary Diagnosis: Aortic graft infectionSignificant Co-morbidities Present on Admission: Hypertension: EssentialSignificant Acute Co-morbidities Developing during Hospitalization: Not applicable at this timeOther Diagnoses: has Bright red blood per rectum; Diverticulosis of colon; CAD (coronary artery disease); Abdominal pain; Leaking abdominal aortic aneurysm (AAA) (HC Code); and AAA (abdominal aortic aneurysm) without rupture (HC Code) on their problem list.Post Discharge Items/Discharge Checklist(s) Vascular Surgery: -ASA prescribed: Yes-P2Y12 inhibitor prescribed: Yes-Systemic AC (DOAC/warfarin) prescribed: No-Statin prescribed: No. Patient states primary care doctor stopped medication in 2019 and would like to ascertain the reason why before potentially restarting-ACEI/ARB/ARNI: Yes-Beta-blocker prescribed: Junious Dresser, [id number] [facility] [place] [phone] up in 3 week(s)On discharge please call [phone] to schedule a follow up appointment within 3 weeks of dischargeYM [facility] at [address]  [address] [place] [phone] Angiogram of Abdomen and PelvisSchedule an appointment as soon as possible for a visit in 3 day(s)On discharge please call [phone] to schedule a CTA of your abdomen and pelvis within 3 weeks of discharge and prior to your follow up with Dr. Darel Hong, MD[address] [place] [phone] Care ProviderFollow upNo future appointments.Medication Reconciliation Discharge: Current Discharge Medication List  START taking these medications  Details acetaminophen (TYLENOL) 325 mg tablet Take 3 tablets (975 mg total) by mouth every 6 (six) hours as needed for pain or fever.Qty: 30 tablet, Refills: 11Start date: 08/28/2022, End date: 09/27/2022  amoxicillin-clavulanate (AUGMENTIN) 500-125 mg per tablet Take 1 tablet (500 mg total) by mouth every 12 (twelve) hours.Qty: 60 tablet, Refills: 11Start date: 08/28/2022   CONTINUE these medications which have CHANGED  Details atorvastatin (LIPITOR) 40 mg tablet Take 1 tablet (40 mg total) by mouth daily.Qty: 30 tablet, Refills: 1Start date: 08/28/2022  cholecalciferol, vitamin D3, 25 mcg (1,000 unit) tablet Take 4 tablets (4,000 Units total) by mouth once a week.Start date: 08/28/2022   CONTINUE these medications which have NOT CHANGED  Details alirocumab (PRALUENT PEN) 75 mg/mL Pen Injector Inject 1 mL (75 mg total) under the skin every 14 (fourteen) days.  ascorbic acid (VITAMIN C ORAL) Take 1 tablet by mouth Daily @1700 .  aspirin 81 MG EC tablet Take 1 tablet (81 mg total) by mouth every other day.Qty: 30 tablet, Refills: 5  clopidogreL (PLAVIX) 75 mg tablet Take 1 tablet (75 mg total) by mouth daily.  febuxostat (ULORIC) 40 mg tablet Take 0.5 tablets (20 mg total) by mouth daily.  losartan (COZAAR) 100 MG tablet Take 1 tablet (100 mg total) by mouth daily.Rob Bunting: 30 tablet, Refills: 0  metoprolol succinate XL (TOPROL-XL) 25 mg 24 hr tablet Take 1 tablet (25 mg total) by mouth daily. Take with or immediately following a meal.  thiamine HCl (VITAMIN B-1 ORAL) Take 1 tablet by mouth daily.   Hospital Course: 18M presented to [facility] on 08/21/2022 from [facility] ED with a month history of blood stained loose stools, which warranted obtaining a [place] scan. Imaging was concerning for possible graft infection. He reported symptom has started while at home in [facility], but had resolved prior to his trip to [place]. He then presented to [facility] ED due to a bout of similar blood stained stool while here. He was admitted and commenced on IV vancomycin and piperacillin-tazobactam. He was given bowel  rest and a PET [place] scan was ordered.Over the next few days, there were no further bouts of bloody stools and he was allowed a diet pending his PET [place] appointment. He was reviewed by GI for his lower GI bleed and they advised no acute GI intervention was indicated, and patient should be advised to obtain a colonoscopy, likely as outpatient if discharged.PET [place] was done which was concerning for graft infection at the aortic bifurcation and extending along the right iliac limb, as well as suspicion for colonic diverticulitis and increased uptake in his prostate (patient noted to have history of low grade prostate ca which was being managed conservatively). After discussion, including at vascular conference, decision was reached that medical management with antibiotics would be the best approach at this time. Patient was then discharged on 08/28/2022 with oral amoxicillin-clavulanate, with out patient follow up with Dr Dimple Casey to be arranged. He was also scheduled for a visit to the gastroenterology for an outpatient follow up, with possible colonoscopy.Objective Data: Temp:  [97.3 ?F (36.3 ?C)-98.7 ?F (37.1 ?C)] 97.3 ?F (36.3 ?C)Pulse:  [57-68] 62Resp:  [18] 18BP: (110-154)/(64-78) 154/78SpO2:  [97 %-99 %] 97 %Device (Oxygen Therapy): room airAdmission weight: Weight: 66.2 kg Discharge weight: Weight: 66.2 kgCognitive Status at Discharge: baselinePhysical Exam at Discharge: Gen: in bed, NADCV: RRPulm: breathing comfortably on RAAbd: soft, minimal lower abdominal tendernessExt: warm, well perfusedRecent Labs   09/27/230621 09/28/230547 09/29/230545 WBC 10.8 11.1* 7.4 HGB 14.3 14.0 13.8 HCT 43.70 42.80 43.50 PLT 196 200 189 Recent Labs   09/27/230606 09/27/230621 09/28/230547 09/29/230545 NA  --  140 142 143 K  --  4.2 4.6 4.4 CL  --  110* 109* 112* CO2  --  20 22 20  BUN  --  22 23 24* CREATININE  --  1.99* 2.10* 1.97* CALCIUM  --  9.5 9.3 9.1 MG 2.1  --  2.0 2.2 PHOS 2.8  --  3.2 2.9 Other Information ALLERGIES: No Known AllergiesPast Medical History: Diagnosis Date ? CKD (chronic kidney disease) August 2012  Stage 3 ? Coronary artery disease  ? Diverticulosis  ? H/O gastroesophageal reflux (GERD)   no longer requires medication ? History of CEA (carotid endarterectomy) February 2006  L side ? HLD (hyperlipidemia)  ? HTN (hypertension)  ? S/P AAA (abdominal aortic aneurysm) repair Repair Nov 2008 ? Sciatica  ? TIA (transient ischemic attack) November 2008  possible  Past Surgical History: Procedure Laterality Date ? CORONARY ANGIOPLASTY WITH STENT PLACEMENT  10/2012  x 2 DES to LAD ? VASCULAR SURGERY    Social History Tobacco Use ? Smoking status: Never ? Smokeless tobacco: Never Substance Use Topics ? Alcohol use: Yes   Comment: 3 drinks of beer, wine or vodka tonic a night since 53yrs of age  Family History Problem Relation Age of Onset ? Kidney disease Mother       1 kidney, reason unknown to family ? Stroke Father  ? Heart disease Brother       MI @ 71 years ? Hearing loss Brother   Author(s)  <30 minutes was spent completing the discharge summary and coordinating the patient's discharge. Electronically Signed:Pamela Maryruth Hancock, APRN 08/28/2022 8:39 AMSamuelson E Ernst Cumpston, MD9/29/2023 12:07 PM

## 2022-08-28 NOTE — Progress Notes
[facility]   Vascular Surgery Progress NoteAttending Provider: Wadie Lessen, MDAdmit Date: 9/22/2023Hospital Day: 6Code status: Full CodeAllergy: Patient has no known allergies.Background:  Cameron Zimmerman is a 88 y.o. male w/ PMHx of low grade prostate ca, CKD, CAD s/p PCI with stents 2013, Diverticulosis with prior GI bleeds, GERD, TIA, L CEA, HLD, HTN, and AAA s/p open repair in 2008 p/w LGIB with c/f graft infectionSubjective/Interm Events: - NAEON- HDS, AVSS- No bloody stools- On vanc + zosyn (day 5)Objective: Temp:  [97.3 ?F (36.3 ?C)-97.8 ?F (36.6 ?C)] 97.8 ?F (36.6 ?C)Pulse:  [60-74] 66Resp:  [14-20] 19BP: (114-131)/(63-80) 121/73SpO2:  [96 %-100 %] 97 %Device (Oxygen Therapy): room airI/O last 3 completed shifts:In: 385.2 [IV Piggyback:385.2]Out: 550 [Urine:550]CBCLab Results Component Value Date  WBC 10.8 08/26/2022  HGB 14.3 08/26/2022  HCT 43.70 08/26/2022  PLT 196 08/26/2022 LYTESLab Results Component Value Date  NA 140 08/26/2022  K 4.2 08/26/2022  CL 110 (H) 08/26/2022  CO2 20 08/26/2022  CREATININE 1.99 (H) 08/26/2022  BUN 22 08/26/2022  GLU 97 08/26/2022  CALCIUM 9.5 08/26/2022  MG 2.1 08/26/2022  PHOS 2.8 08/26/2022 LFTsLab Results Component Value Date  BILITOT 0.5 08/21/2022  AST 21 08/21/2022  ALT 22 08/21/2022  ALKPHOS 81 08/21/2022 PT/INR/PTTLab Results Component Value Date  INR 1.02 08/21/2022  PTT 24.5 06/13/2013 Microbiology:No results found for: LABBLOO, LABURIN, LOWERRESPIRAPHYSICAL EXAMGen: in bed, NADCV: RRPulm: breathing comfortably on RAAbd: soft, minimal lower abdominal tendernessExt: wwpAssessment Cameron Zimmerman is a 88 y.o. male, Hospital Day: 6, being evaluated for LGIB, with concern for possible graft infectionPlan - PET [place] today- NPO for now- Review after PET [place]- Cont abxNotifications : For questions, please contact vascular teamSigned:Joliana Claflin E Costa Jha, MBBS9/27/2023 6:30 AMAttending Addendum:

## 2022-08-28 NOTE — Plan of Care
Plan of Care Overview/ Patient Status    2300-0700:Patient A/Ox4. Denies pain. Brady 40s-50s on tele, team aware. Denies CP, SOB, palpitations/ continues IV abx. OOB independently. No acute complaints. Safety maintained, WCTM.Problem: Adult Inpatient Plan of CareGoal: Plan of Care ReviewOutcome: Interventions implemented as appropriateGoal: Patient-Specific Goal (Individualized)Outcome: Interventions implemented as appropriateGoal: Absence of Hospital-Acquired Illness or InjuryOutcome: Interventions implemented as appropriateGoal: Optimal Comfort and WellbeingOutcome: Interventions implemented as appropriateGoal: Readiness for Transition of CareOutcome: Interventions implemented as appropriate Problem: Fall Injury RiskGoal: Absence of Fall and Fall-Related InjuryOutcome: Interventions implemented as appropriate Problem: Gastrointestinal BleedingGoal: Optimal Coping with Acute IllnessOutcome: Interventions implemented as appropriateGoal: HemostasisOutcome: Interventions implemented as appropriate

## 2022-08-28 NOTE — Progress Notes
[facility]   Vascular Surgery Progress NoteAttending Provider: Wadie Lessen, MDAdmit Date: 9/22/2023Hospital Day: 7Code status: Full CodeAllergy: Patient has no known allergies.Background:  Howard Bruer is a 88 y.o. male w/ PMHx of low grade prostate ca, CKD, CAD s/p PCI with stents 2013, Diverticulosis with prior GI bleeds, GERD, TIA, L CEA, HLD, HTN, and AAA s/p open repair in 2008 p/w LGIB with c/f graft infectionSubjective/Interm Events: - NAEON- HDS, AVSS- Report to have hd slight blood streaked bowel movement yday and c/o slight abdominal pain today- PET [place] concerning for graft infection and early acute diverticulitis- On vanc + zosyn (day 6)Objective: Temp:  [97.8 ?F (36.6 ?C)-98 ?F (36.7 ?C)] 97.8 ?F (36.6 ?C)Pulse:  [62-68] 68Resp:  [18] 18BP: (115-128)/(72-75) 128/75SpO2:  [98 %-100 %] 98 %Device (Oxygen Therapy): room airI/O last 3 completed shifts:In: 437.8 [P.O.:236; IV Piggyback:201.8]Out: 550 [Urine:550]CBCLab Results Component Value Date  WBC 10.8 08/26/2022  HGB 14.3 08/26/2022  HCT 43.70 08/26/2022  PLT 196 08/26/2022 LYTESLab Results Component Value Date  NA 140 08/26/2022  K 4.2 08/26/2022  CL 110 (H) 08/26/2022  CO2 20 08/26/2022  CREATININE 1.99 (H) 08/26/2022  BUN 22 08/26/2022  GLU 121 (H) 08/26/2022  CALCIUM 9.5 08/26/2022  MG 2.1 08/26/2022  PHOS 2.8 08/26/2022 LFTsLab Results Component Value Date  BILITOT 0.5 08/21/2022  AST 21 08/21/2022  ALT 22 08/21/2022  ALKPHOS 81 08/21/2022 PT/INR/PTTLab Results Component Value Date  INR 1.02 08/21/2022  PTT 24.5 06/13/2013 Microbiology:Lab Results Component Value Date  LABBLOO No Growth to Date 08/26/2022  LABBLOO No Growth to Date 08/26/2022 Imaging:PET [place] Skull to Thigh Aortic AneurysmResult Date: 9/27/2023PET [place] SKULL TO THIGH AORTIC ANEURYSM [facility]) performed on 08/26/2022 9:57 AM INDICATION: Weeks of melena. Right lower quadrant/right pelvic tenderness to palpation. Remote open AAA repair with aortobiiliac graft. COMPARISON: [place] of the abdomen and pelvis dated September 22nd 2023 TECHNIQUE: Oral contrast was given approximately 60 minutes prior to the [place] scan. Approximately 80 minutes following intravenous administration of 10.5 mCi of F-18 FDG, PET/[place] scan from the base of the skull to the mid thighs was performed using 3D acquisition. Transverse image reconstruction using an iterative algorithm was performed with reoriented tomograms displayed in the axial, coronal, and sagittal planes. The [place] scan was performed at a low dose and without intravenous contrast and therefore is not of diagnostic quality. Patient's serum glucose was 97 mg/dl as per finger stick. TECHNICAL LIMITATIONS: None FINDINGS: Reference Values: Blood pool SUV Max: 2.3 Liver SUV Max: 3.4 Head and Neck: No hypermetabolic mass or lymphadenopathy. Chest: No hypermetabolic pulmonary nodule, mass or infiltrate. Mildly avid hilar nodes, favored reactive. No hypermetabolic mediastinal or axillary lymph nodes. Abdomen/Pelvis: There is a large hiatal hernia with organoaxial rotation. There is physiologic uptake in the liver, pancreas, adrenal glands, kidneys, and bladder. There is significant colonic diverticulosis with prominent hypermetabolism and new mild fat stranding in the ascending colon (series 3 image 177). Fat stranding is new compared to [place] dated September 22. Patient is status post an open AAA repair with an aortobiiliac graft. There is an ovoid soft tissue density surrounding the right iliac limb of the graft, similar to recent [place]. There is hypermetabolism along the aortic graft, with significant hypermetabolism along the course of the right iliac limb of the graft, without significant involvement of the soft tissue density itself (SUV max 7.2, series 3 image 209). There is unremarkable uptake along the left iliac limb of the graft. There is a subcentimeter retroaortic lymph node mild FDG  avidity which is likely reactive, SUV max 3.1 (series 3 image 184). A hypermetabolic focus in the right prostate gland, SUV max 6.6, image 263 is highly suspicious for prostate cancer. There is a right renal cyst. Bilateral kidneys are mildly atrophic in appearance. Musculoskeletal: No hypermetabolic osseous lesion.  1. There is uptake along the aortic graft, with prominent hypermetabolism at the bifurcation and extending along the right iliac limb, highly suspicious for infection. Again seen is a soft tissue density around the right iliac graft limb, potentially a hematoma, similar to recent prior and without metabolic activity distinct from the graft. 2. Colonic diverticulosis with focal FDG avidity and new mild fat stranding in the ascending colon. This may represent early acute diverticulitis, although an underlying neoplasm cannot be excluded. Correlate with colonoscopy after treatment and once any concern of acute diverticulitis has resolved. 3. A hypermetabolic focus in the right prostate gland is highly suspicious for prostate cancer. Correlate with PSA. Findings discussed with Dwaine Gale by Dr. Adrian Prince on 08/26/2022 11:19 AM. RECOMMENDATIONS: No further recommendations Report initiated by:  Roderic Ovens, MD Reported and signed by: Jenny Reichmann, MD  [facility] Radiology and Biomedical Imaging PHYSICAL EXAMGen: in bed, NADCV: RRPulm: breathing comfortably on RAAbd: soft, minimal lower abdominal tendernessExt: wwpAssessment Ayron Florance is a 88 y.o. male, Hospital Day: 7, being evaluated for LGIB, with concern for possible graft infectionPlan - Regular diet- PET [place] being reviewed by attending and further plans will be communicated subsequently- Cont abxNotifications : For questions, please contact vascular teamSigned:Queenie Aufiero E Niley Helbig, MBBS9/28/2023 6:30 AMAttending Addendum:

## 2022-08-29 ENCOUNTER — Encounter: Admit: 2022-08-29 | Payer: PRIVATE HEALTH INSURANCE

## 2022-08-31 LAB — BLOOD CULTURE   (BH GH L LMW YH)
BKR BLOOD CULTURE: NO GROWTH
BKR BLOOD CULTURE: NO GROWTH

## 2022-09-01 ENCOUNTER — Telehealth: Admit: 2022-09-01 | Payer: PRIVATE HEALTH INSURANCE

## 2022-09-01 NOTE — Telephone Encounter
Patient calling to schedule colonoscopy. Per patient he lives in [place] and will only be here until 09/08/22. Pt's contact number is  [phone]

## 2022-09-02 NOTE — Progress Notes
[facility] Care Navigation:  Transition of Care Note Care Navigation spoke with: PatientDischarging Hospital: [facility] Admission Date: 9/22/2023Hospital Discharge Date: 08/28/2022    Diagnosis: Leaking abdominal aortic aneurysmDischarge location: HomeHOSPITALIZATION:Reason patient admitted: Ed presentation for c/o abd pain Blood in stool intermittently x1 month, [place] abd  Imaging was concerning for possible graft infection ( AA). Plan for close vasc surgery follow & GI Op for possible colonoscopyDiagnosis at discharge: Leaking abdominal aortic aneurysmCURRENT STATE:Since discharge patient reports feeling: Same Patient cared for by: None  REVIEW OF AFTER VISIT SUMMARY DOCUMENT: MEDICATION CHANGES:Validated NEW medications to take: Yesstatin on AVS changed to atorvastatin, sent to  pharmacyValidated Changed medications to take: N/AValidated Stopped medications to NOT take: changed after DC to active rx, not taking rosuvastatinIssues obtaining prescriptions: No FOLLOW-UP APPOINTMENTS and TRANSPORTATION:Patient aware of scheduled appointments: YesAwareness and assistance with appointments needing to be scheduled: Yes and assistance providedTransportation concerns for follow-up appointment: NoDME and HOME HEALTH SERVICES:Durable medical equipment received: N/AContact has been made with home care agency: N/APlan established for follow up labs/tests: YesCT abd to be scheduled this week - ref in place for scheduling to call, pt will call directly if not hearing from by monday ADDITIONAL PATIENT NEEDS: Additional patient needs addressed: None at this time, provided [facility] CC contact should circumstances changes

## 2022-09-04 ENCOUNTER — Inpatient Hospital Stay: Admit: 2022-09-04 | Payer: PRIVATE HEALTH INSURANCE

## 2022-09-04 NOTE — Telephone Encounter
Pt called to schedule his colonoscopy. He states he has extended his stay until October 25th. Verified best call back number [phone]

## 2022-09-07 ENCOUNTER — Inpatient Hospital Stay: Admit: 2022-09-07 | Discharge: 2022-09-07 | Payer: MEDICARE

## 2022-09-07 ENCOUNTER — Encounter: Admit: 2022-09-07 | Payer: PRIVATE HEALTH INSURANCE | Attending: Anesthesiology

## 2022-09-07 DIAGNOSIS — Z8679 Personal history of other diseases of the circulatory system: Secondary | ICD-10-CM

## 2022-09-07 DIAGNOSIS — Z9889 Other specified postprocedural states: Secondary | ICD-10-CM

## 2022-09-07 MED ORDER — IOHEXOL 350 MG IODINE/ML INTRAVENOUS SOLUTION
350 mg iodine/mL | Freq: Once | INTRAVENOUS | Status: CP | PRN
Start: 2022-09-07 — End: ?
  Administered 2022-09-07: 19:00:00 350 mL via INTRAVENOUS

## 2022-09-07 MED ORDER — SODIUM CHLORIDE 0.9 % LARGE VOLUME SYRINGE FOR AUTOINJECTOR
Freq: Once | INTRAVENOUS | Status: CP | PRN
Start: 2022-09-07 — End: ?
  Administered 2022-09-07: 19:00:00 via INTRAVENOUS

## 2022-09-10 ENCOUNTER — Telehealth: Admit: 2022-09-10 | Payer: PRIVATE HEALTH INSURANCE

## 2022-09-10 ENCOUNTER — Encounter: Admit: 2022-09-10 | Payer: MEDICARE | Attending: Internal Medicine

## 2022-09-10 ENCOUNTER — Telehealth: Admit: 2022-09-10 | Payer: PRIVATE HEALTH INSURANCE | Attending: Vascular Surgery

## 2022-09-10 NOTE — Telephone Encounter
Pt did not make it to his appt with Raynelle Fanning today, he also cx 10/17 colon, he is leaving for [place] soon- he said he is feeling better and wants to get the colon in [place]- I let him know to call me if he needs [place] before he leaves, he was very appreciative and knows to call if he wants to r/s his appts

## 2022-09-10 NOTE — Telephone Encounter
Patient is cancelling his colonoscopy scheduled for 10/17.  He will have it done in [place]

## 2022-09-15 ENCOUNTER — Encounter: Admit: 2022-09-15 | Payer: MEDICARE

## 2022-09-15 ENCOUNTER — Encounter: Admit: 2022-09-15 | Payer: PRIVATE HEALTH INSURANCE | Attending: Anesthesiology

## 2022-09-16 NOTE — Telephone Encounter
Unable to reach patient by phone or leave VM.  Message sent to Dr. Dimple Casey.09/10/22 Patient Message:EA pt Wants to know if he's ok to travel back to [place], please advise Addendum 8:46am on 09/11/22:Spoke with patient by phone today and made him aware message has been sent to Dr. Dimple Casey.  Patient states he has now made up his mind to return to [facility] for 11 months on 11/28 no matter what.  He denies any vascular issues and feels much better.  All symptoms have resolved.  He remain on his antibiotics.  + color and warmth to his extremities.  Denies pain. Addendum 3:22pm on  09/16/22:Patient made aware Dr. Dimple Casey is in agreement with the patient returning to [facility].  Patient states understanding and appreciates the f/u.  Patient will keep the 11/3 telephone appointment.

## 2022-10-02 ENCOUNTER — Encounter: Admit: 2022-10-02 | Payer: PRIVATE HEALTH INSURANCE | Attending: Vascular Surgery

## 2022-10-02 ENCOUNTER — Ambulatory Visit: Admit: 2022-10-02 | Payer: MEDICARE | Attending: Vascular Surgery

## 2022-10-02 DIAGNOSIS — T82390D Other mechanical complication of aortic (bifurcation) graft (replacement), subsequent encounter: Secondary | ICD-10-CM

## 2022-10-26 NOTE — Progress Notes
TELEPHONE VISIT: For this visit the clinician and patient were present via telephone (audio only).Patient counseled on available options for visit type; Patient elected telephone visit (audio only); Patient consent given for telephone (audio only) visit : YesState patient is located in: [place] is appropriately licensed in the above state to provide care for this visitPatient Identity was confirmed during this call.  Other individuals actively participating in the telephone encounter and their name/relation to the patient: noneTotal time spent in medical telephone (audio only) discussion: 15Because this visit was completed over telephone, a hands-on physical exam was not performed.  Patient understands and knows to call back if condition changes.Patient reports overall good health status.  He is able to ambulate.  He has no fevers or chills.  He is on antibiotic therapy with Augmentin 16109 twice daily.  He also reported some tooth problem for which he will need some dental procedure.  I reviewed his [place] scan with contrast and there is no evidence of progression of limb infection in fact the inflammation around right aortobifemoral bypass limb is less.  He had no apparent evidence of gastrointestinal bleeding.  His bowel function is overall regular.  He has some episodes of constipation and loose stools.  I encouraged him to continue antibiotic therapy indefinitely life bacterial culture to prevent C diff colitis.  Obtain another [place] scan in 6 months to evaluate his graft appearance.  All questions were answered

## 2024-04-27 ENCOUNTER — Inpatient Hospital Stay: Admit: 2024-04-27 | Discharge: 2024-04-27 | Payer: Medicare (Managed Care) | Attending: Emergency Medicine

## 2024-04-27 ENCOUNTER — Encounter: Admit: 2024-04-27 | Payer: PRIVATE HEALTH INSURANCE | Attending: Emergency Medicine

## 2024-04-27 ENCOUNTER — Emergency Department: Admit: 2024-04-27 | Payer: Medicare (Managed Care)

## 2024-04-27 DIAGNOSIS — H7013 Chronic mastoiditis, bilateral: Secondary | ICD-10-CM

## 2024-04-27 DIAGNOSIS — I2510 Atherosclerotic heart disease of native coronary artery without angina pectoris: Secondary | ICD-10-CM

## 2024-04-27 DIAGNOSIS — I10 Essential (primary) hypertension: Secondary | ICD-10-CM

## 2024-04-27 DIAGNOSIS — N189 Chronic kidney disease, unspecified: Secondary | ICD-10-CM

## 2024-04-27 DIAGNOSIS — Z7982 Long term (current) use of aspirin: Secondary | ICD-10-CM

## 2024-04-27 DIAGNOSIS — K5790 Diverticulosis of intestine, part unspecified, without perforation or abscess without bleeding: Secondary | ICD-10-CM

## 2024-04-27 DIAGNOSIS — M7918 Myalgia, other site: Secondary | ICD-10-CM

## 2024-04-27 DIAGNOSIS — K449 Diaphragmatic hernia without obstruction or gangrene: Secondary | ICD-10-CM

## 2024-04-27 DIAGNOSIS — G459 Transient cerebral ischemic attack, unspecified: Secondary | ICD-10-CM

## 2024-04-27 DIAGNOSIS — M5430 Sciatica, unspecified side: Secondary | ICD-10-CM

## 2024-04-27 DIAGNOSIS — H9202 Otalgia, left ear: Secondary | ICD-10-CM

## 2024-04-27 DIAGNOSIS — Z79899 Other long term (current) drug therapy: Secondary | ICD-10-CM

## 2024-04-27 DIAGNOSIS — Z9889 Other specified postprocedural states: Secondary | ICD-10-CM

## 2024-04-27 DIAGNOSIS — Z7902 Long term (current) use of antithrombotics/antiplatelets: Secondary | ICD-10-CM

## 2024-04-27 DIAGNOSIS — E785 Hyperlipidemia, unspecified: Secondary | ICD-10-CM

## 2024-04-27 DIAGNOSIS — Z8719 Personal history of other diseases of the digestive system: Secondary | ICD-10-CM

## 2024-04-27 LAB — BASIC METABOLIC PANEL
BKR ANION GAP: 6 (ref 5–16)
BKR BLOOD UREA NITROGEN: 19 mg/dL — ABNORMAL HIGH (ref 7–18)
BKR BUN / CREAT RATIO: 10.7 (ref 8.0–23.0)
BKR CALCIUM: 8.7 mg/dL (ref 8.5–10.5)
BKR CHLORIDE: 102 mmol/L (ref 98–107)
BKR CO2: 26 mmol/L (ref 21–32)
BKR CREATININE: 1.77 mg/dL — ABNORMAL HIGH (ref 0.80–1.30)
BKR EGFR, CREATININE (CKD-EPI 2021): 36 mL/min/{1.73_m2} — ABNORMAL LOW (ref >=60)
BKR GLUCOSE: 87 mg/dL (ref 70–100)
BKR POTASSIUM: 4.1 mmol/L (ref 3.5–5.1)
BKR SODIUM: 134 mmol/L — ABNORMAL LOW (ref 136–145)

## 2024-04-27 MED ORDER — CIPROFLOXACIN 500 MG TABLET
500 | ORAL_TABLET | Freq: Two times a day (BID) | ORAL | 1 refills | 10.00000 days | Status: AC
Start: 2024-04-27 — End: ?

## 2024-04-27 MED ORDER — IOHEXOL 350 MG IODINE/ML INTRAVENOUS SOLUTION
350 | Freq: Once | INTRAVENOUS | Status: CP | PRN
Start: 2024-04-27 — End: ?
  Administered 2024-04-27: 18:00:00 350 mL via INTRAVENOUS

## 2024-04-27 MED ORDER — SODIUM CHLORIDE 0.9 % IV BOLUS NEW BAG (DROPS CHARGE)
0.9 | Freq: Once | INTRAVENOUS | Status: CP
Start: 2024-04-27 — End: ?
  Administered 2024-04-27: 15:00:00 0.9 mL/h via INTRAVENOUS

## 2024-04-27 MED ORDER — PANTOPRAZOLE 20 MG TABLET,DELAYED RELEASE
20 | ORAL_TABLET | Freq: Every day | ORAL | 1 refills | 30.00000 days | Status: AC
Start: 2024-04-27 — End: ?

## 2024-04-27 MED ORDER — HYDROCODONE 5 MG-ACETAMINOPHEN 325 MG TABLET
5-325 | Freq: Once | ORAL | Status: CP
Start: 2024-04-27 — End: ?
  Administered 2024-04-27: 14:00:00 5-325 mg via ORAL

## 2024-04-27 MED ORDER — PREDNISONE 10 MG TABLET
10 | ORAL_TABLET | Freq: Every day | ORAL | 1 refills | 30.00000 days | Status: AC
Start: 2024-04-27 — End: ?

## 2024-04-27 NOTE — ED Notes
 7:06 PM Assumed care of pt in generally stable condition, family member at bedside. Pt is A & O x 4 breathing with ease on room air, no apparent distress noted. Pt pending [place] results  7:31 PM Dr Francisco Irving at bedside for review. Pt assessed as stable for discharge with instructions.7:38 PM PIV removed, discharge instructions given7:40 PM Pt ambulated off unit accompanied by relatives, discharge instructions in hand

## 2024-04-27 NOTE — Discharge Instructions
 See your doctor for further care and evaluation, call next business day in the morning to notify of ED visit. Attempt to get a recheck within 5 business days. If your doctor is not available, go to an urgent care for evaluation. If you have been prescribed medications, take them as directed. If you develop severe or worrisome symptoms, contact your doctor or return to the emergency department. All diagnoses made in the emergency department are tentative, and secondary exams are required if you do not feel better as expected. If worrisome symptoms occur, return to the emergency department without delay.Continue tramadol for pain

## 2024-04-28 ENCOUNTER — Telehealth: Admit: 2024-04-28 | Payer: PRIVATE HEALTH INSURANCE

## 2024-04-28 NOTE — ED Provider Notes
 Chief Complaint Patient presents with  Pain   PT  c/o shooting pain to left side of body that starts on left side of face radiating down to left lateral torso.  Pt earlier this year had an ng tube for a stomach issue. After that (about a month ago), he developed L ear pain. He has been on amox and ceftinir without relief. Pain was in ear, then L head and neck, and now reaches torso. He awakens at night sometimes screaming in pain per daughter. No fever, vomiting, or focal neuro symptoms noted. Pt has hx of stage 3 CKD and some dementia. He's 89yo.CHART REVIEWCoronary artery disease CKD (chronic kidney disease) TIA (transient ischemic attack) S/P AAA (abdominal aortic aneurysm) repair History of CEA (carotid endarterectomy) H/O gastroesophageal reflux (GERD) HTN (hypertension) HLD (hyperlipidemia) Diverticulosis Sciatica Hiatal hernia Surgical  Vascular surgery  Coronary angioplasty with stent (10/2012) SocialTobacco: Never Alcohol: Alcohol User MED REVIEW   acetic acid (VOSOL) 2 % otic solutionPlace 4 drops into the right ear 4 (four) times daily for 7 days.      alirocumab  (PRALUENT  PEN) 75 mg/mL Pen InjectorInject 1 mL (75 mg total) under the skin every 14 (fourteen) days.      Patient not taking: Reported on 04/22/2024      amLODIPine (NORVASC) 10 mg tabletTake 1 tablet (10 mg total) by mouth daily.      amLODIPine (NORVASC) 2.5 mg tabletTAKE ONE TABLET BY MOUTH ONE TIME DAILY AS NEEDED FOR BLOOD PRESSURE GREATER THAN 130/80      ascorbic acid (VITAMIN C  ORAL)Take 1 tablet by mouth Daily @1700 .      Patient not taking: Reported on 04/22/2024      ascorbic acid, vitamin C , (VITAMIN C ) 1000 mg tabletTake 1 tablet (1,000 mg total) by mouth daily.      aspirin  81 MG EC tabletTake 1 tablet (81 mg total) by mouth every other day.      atorvastatin  (LIPITOR) 40 mg tabletTake 1 tablet (40 mg total) by mouth daily.      Patient not taking: Reported on 04/22/2024      cefdinir (OMNICEF) 300 mg capsuleTake 1 capsule (300 mg total) by mouth 2 (two) times daily for 10 days.      cholecalciferol , vitamin D3, 25 mcg (1,000 unit) tabletTake 4 tablets (4,000 Units total) by mouth once a week.      Patient not taking: Reported on 04/22/2024      clopidogreL  (PLAVIX ) 75 mg tabletTake 1 tablet (75 mg total) by mouth daily.      febuxostat  (ULORIC ) 40 mg tabletTake 0.5 tablets (20 mg total) by mouth daily.      Patient not taking: Reported on 04/22/2024      losartan  (COZAAR ) 100 MG tablet (Expired)Take 1 tablet (100 mg total) by mouth daily..      meclizine (ANTIVERT) 25 mg tabletTake 1 tablet (25 mg total) by mouth 3 (three) times daily as needed.      metoprolol  succinate XL (TOPROL -XL) 25 mg 24 hr tabletTake 1 tablet (25 mg total) by mouth daily. Take with or immediately following a meal.      nitroGLYCERIN (NITROSTAT) 0.4 mg SL tablet1 tab(s) sublingually      Patient taking differently: as needed.      thiamine HCl (VITAMIN B-1 ORAL)Take 1 tablet by mouth daily.      Patient not taking: Reported on 04/22/2024      traMADoL (ULTRAM) 50 mg tabletTake 2 tablets every day by oral route.  traMADoL (ULTRAM) 50 mg tabletTake 1 tablet (50 mg total) by mouth every 6 (six) hours as needed for pain (for severe pain).   ALLERGIESNKDAMDM  Physical ExamED Triage Vitals [04/27/24 1236]BP: (!) 146/70Pulse: 76Pulse from  O2 sat: n/aResp: 18Temp: 97.4 ?F (36.3 ?C)Temp src: OralSpO2: 99 % BP (!) 135/98  - Pulse 76  - Temp 97.8 ?F (36.6 ?C) (Oral)  - Resp 16  - Wt 65.8 kg (145 lb)  - SpO2 97%  - BMI 24.13 kg/m? Physical Exam ProceduresAttestation/Critical CareClinical Impressions as of 04/27/24 1926 Chronic mastoiditis of both sides [place] reportThe external auditory canal is clear. There is no thickening of the tympanic membrane.  Small right mastoid effusion.The middle ear cavity are clear. The sinus tympani is clear. The ossicular chain is intact.  The inner ear structures including the cochlea and semicircular canals are unremarkable. There is no evidence of superior semicircular canal dehiscence. The internal auditory canal and vestibular aqueduct are unremarkable. The facial nerve appears normal throughout its course. The walls of the carotid canal and jugular foramen are intact. The jugular bulb is unremarkable. Left:Soft tissue swelling of the external auditory canal. There is no thickening of the tympanic membrane.  Left mastoid and middle ear effusion.The sinus tympani is clear. The ossicular chain is intact.  The inner ear structures including the cochlea and semicircular canals are unremarkable. There is no evidence of superior semicircular canal dehiscence. The internal auditory canal and vestibular aqueduct are unremarkable. The facial nerve appears normal throughout its course. The walls of the carotid canal and jugular foramen are intact. The jugular bulb is unremarkable. Mucous retention cysts within the inferior maxillary sinus.Mild mucosal thickening of the paranasal sinuses.Bilateral lens replacements.Atherosclerotic calcifications of the internal carotid arteries. IMPRESSION:Soft tissue swelling of the left external auditory canal.Left mastoid and middle ear effusion. Case discussed with Dr. Travis Friedman, possible chronic mastoiditis, will rx analgesic, steroid, cipro. Considered drops but pt canal so swollen that penetration is unlikely. That said, the swellling has normal tissue color, so does not appear acute. Dose adjustments and meds chosen considering CKD hxED DispositionDischarge  Jewell Mose, MD05/29/25 1313 Jewell Mose, MD05/29/25 1917 Jewell Mose, MD05/29/25 1926 Jewell Mose, MD05/30/25 1345 Jewell Mose, MD05/30/25 [phone]

## 2024-04-28 NOTE — Telephone Encounter
 Called x1, no answer. Lvm requesting pt c/b to schedule

## 2024-04-28 NOTE — Telephone Encounter
 Copied from CRM #[id number]. Topic: General Message - [facility]>> Apr 28, 2024  8:22 AM Brendia Callander wrote:[facility] CENTER MESSAGETime of call:   8:22 AMCaller:   LawrenceCaller's relationship to patient:  Son  Radiographer, therapeutic from [employer], hospital, agency, etc.):  n/a   Specialist you are calling for:  AnyReason for call:   Fergus called in behalf of his Father to schedule and ED follow up for Chronic mastoiditis of both sides, next available was offered stated that's too far out, requesting to be seen sooner, please advise on scheduling. Best telephone number for callback:   [phone] [facility] Scheduler

## 2024-05-01 NOTE — Telephone Encounter
 Lvm to schedule

## 2024-05-04 ENCOUNTER — Encounter: Admit: 2024-05-04 | Payer: PRIVATE HEALTH INSURANCE

## 2024-05-05 ENCOUNTER — Ambulatory Visit: Admit: 2024-05-05 | Payer: PRIVATE HEALTH INSURANCE | Attending: Otolaryngology

## 2024-05-05 ENCOUNTER — Encounter: Admit: 2024-05-05 | Payer: PRIVATE HEALTH INSURANCE | Attending: Otolaryngology

## 2024-05-05 ENCOUNTER — Inpatient Hospital Stay: Admit: 2024-05-05 | Discharge: 2024-05-05 | Payer: Medicare (Managed Care)

## 2024-05-05 ENCOUNTER — Ambulatory Visit: Admit: 2024-05-05 | Payer: Medicare (Managed Care) | Attending: Otolaryngology

## 2024-05-05 VITALS — BP 160/90 | HR 78 | Temp 98.00000°F | Wt 143.3 lb

## 2024-05-05 DIAGNOSIS — M5430 Sciatica, unspecified side: Secondary | ICD-10-CM

## 2024-05-05 DIAGNOSIS — N189 Chronic kidney disease, unspecified: Secondary | ICD-10-CM

## 2024-05-05 DIAGNOSIS — Z8719 Personal history of other diseases of the digestive system: Secondary | ICD-10-CM

## 2024-05-05 DIAGNOSIS — K449 Diaphragmatic hernia without obstruction or gangrene: Secondary | ICD-10-CM

## 2024-05-05 DIAGNOSIS — I10 Essential (primary) hypertension: Secondary | ICD-10-CM

## 2024-05-05 DIAGNOSIS — K5790 Diverticulosis of intestine, part unspecified, without perforation or abscess without bleeding: Secondary | ICD-10-CM

## 2024-05-05 DIAGNOSIS — H9212 Otorrhea, left ear: Secondary | ICD-10-CM

## 2024-05-05 DIAGNOSIS — H60392 Other infective otitis externa, left ear: Secondary | ICD-10-CM

## 2024-05-05 DIAGNOSIS — Z9889 Other specified postprocedural states: Secondary | ICD-10-CM

## 2024-05-05 DIAGNOSIS — G459 Transient cerebral ischemic attack, unspecified: Secondary | ICD-10-CM

## 2024-05-05 DIAGNOSIS — E785 Hyperlipidemia, unspecified: Secondary | ICD-10-CM

## 2024-05-05 DIAGNOSIS — I2510 Atherosclerotic heart disease of native coronary artery without angina pectoris: Secondary | ICD-10-CM

## 2024-05-05 MED ORDER — LEVOFLOXACIN 500 MG TABLET
500 | ORAL_TABLET | Freq: Every day | ORAL | 1 refills | 1.00000 days | Status: AC
Start: 2024-05-05 — End: ?

## 2024-05-05 MED ORDER — NEOMYCIN-POLYMYXIN-HYDROCORT 3.5 MG-10,000 UNIT/ML-1 % EAR DROPS,SUSP
3.5-10000-1 | Freq: Three times a day (TID) | OTIC | 2 refills | 15.00000 days | Status: AC
Start: 2024-05-05 — End: ?

## 2024-05-05 MED ORDER — TRAMADOL 50 MG TABLET
50 | ORAL_TABLET | Freq: Four times a day (QID) | ORAL | 1 refills | 5.00000 days | Status: AC | PRN
Start: 2024-05-05 — End: ?

## 2024-05-05 NOTE — Progress Notes
 [facility] OF MEDICINE						Department of SurgeryDivision of Otolaryngology - Head and Neck SurgeryPatient Name: Cameron Zimmerman of Birth: 08-26-1934 Date of Visit: 6/6/25The patient was referred by [facility]  with a chief complaint of: ear infection History of Present Illness:  Cameron Zimmerman is a 89 y.o. male who is seen with severe left ear pain.  Cameron Zimmerman significant drainage but has pain radiating around ear and temporalis region.  Pain extends to neck.  Has been on Cipro  and cephalexin  Past Medical History:  Past Medical History: Diagnosis Date  CKD (chronic kidney disease) 07/2011  Stage 3  Coronary artery disease   Diverticulosis   H/O gastroesophageal reflux (GERD)   no longer requires medication  Hiatal hernia   History of CEA (carotid endarterectomy) 12/2004  L side  HLD (hyperlipidemia)   HTN (hypertension)   S/P AAA (abdominal aortic aneurysm) repair Repair Nov 2008  Sciatica   TIA (transient ischemic attack) 10/2007  possible  Past Surgical History:  Past Surgical History: Procedure Laterality Date  CORONARY ANGIOPLASTY WITH STENT PLACEMENT  10/2012  x 2 DES to LAD  VASCULAR SURGERY    Allergies:  Allergies as of 05/05/2024  (No Known Allergies) Medications:  Current Outpatient Medications Medication Sig  Praluent  Pen Inject 1 mL (75 mg total) under the skin every 14 (fourteen) days. (Patient not taking: Reported on 04/22/2024)  amLODIPine Take 1 tablet (10 mg total) by mouth daily.  amLODIPine TAKE ONE TABLET BY MOUTH ONE TIME DAILY AS NEEDED FOR BLOOD PRESSURE GREATER THAN 130/80  ascorbic acid (VITAMIN C  ORAL) Take 1 tablet by mouth Daily @1700 . (Patient not taking: Reported on 04/22/2024)  ascorbic acid (vitamin C ) Take 1 tablet (1,000 mg total) by mouth daily.  aspirin  Take 1 tablet (81 mg total) by mouth every other day.  atorvastatin  Take 1 tablet (40 mg total) by mouth daily. (Patient not taking: Reported on 04/22/2024)  cholecalciferol  (vitamin D3) Take 4 tablets (4,000 Units total) by mouth once a week. (Patient not taking: Reported on 04/22/2024)  ciprofloxacin  HCl Take 1 tablet (500 mg total) by mouth every 12 (twelve) hours for 7 days.  clopidogreL  Take 1 tablet (75 mg total) by mouth daily.  febuxostat  Take 0.5 tablets (20 mg total) by mouth daily. (Patient not taking: Reported on 04/22/2024)  losartan  Take 1 tablet (100 mg total) by mouth daily..  meclizine Take 1 tablet (25 mg total) by mouth 3 (three) times daily as needed.  metoprolol  succinate XL Take 1 tablet (25 mg total) by mouth daily. Take with or immediately following a meal.  nitroGLYCERIN 1 tab(s) sublingually (Patient taking differently: as needed.)  pantoprazole  Take 1 tablet (20 mg total) by mouth daily for 10 days.  thiamine HCl (VITAMIN B-1 ORAL) Take 1 tablet by mouth daily. (Patient not taking: Reported on 04/22/2024)  traMADoL Take 2 tablets every day by oral route.  traMADoL Take 1 tablet (50 mg total) by mouth every 6 (six) hours as needed for pain (for severe pain). No current facility-administered medications for this visit.  Review of Systems:  A 24-point review of systems was taken and noted by clinical support staff. All systems were negative/normal except for what is noted in the HPI and/or below by way of patient intake questionaire.Physical Examination:  Vital Signs: 	There were no vitals taken for this visit. Otolaryngic Examination:General: 	Healthy appearing; alert and oriented.Head:		Normal cephalic Sinus:		Non tender, no purulenceSalivary glands: No massesFacial nerve:	Symmetrical Right Ear: 	Tympanic membrane: Normal; external auditory canal: Normal; auricle: Normal.Left Ear: 	Tympanic  membrane: Normal; external auditory canal:pus in ear canal with granulation tissue along posterior canal wall and meatus  area cultures auricle: Normal.Eyes: 		Globes normal; extraocular muscles intact. No nystagmusNose: 		External nose normal; nasal airway clear.OC/OP: 	Normal mucosa; no lesions.Neck: 		No lymphadenopathy. Imaging:I have independently reviewed the imaging from:- [place] Temporal Bone:  Soft tissue swelling of the left external auditory canal.Left mastoid and middle ear effusion. Assessment:  Cameron Zimmerman is a 89 y.o. male with left otitis externaPlan:  Levaquin 500, tramadol 50 mg, CSO BIDWill call with culture resultsCc:Johnson, Lafonda Piety, MD Jabin Tapp F. Josh Nicolosi, MD, FACSYale Otolaryngology - Head and Neck SurgerySection Chief, Otology, Neurotology and [facility] Surgery

## 2024-05-05 NOTE — Progress Notes
 The following is the transcribed Review of Systems completed by the patient at intake.Review of Systems Constitutional: Negative.  HENT:  Positive for hearing loss.       Head pain down left side of face Eyes: Negative.  Respiratory: Negative.   Cardiovascular: Negative.  Gastrointestinal: Negative.  Genitourinary: Negative.  Musculoskeletal: Negative.  Skin: Negative.  Neurological: Negative.  Endo/Heme/Allergies: Negative.  Psychiatric/Behavioral: Negative.

## 2024-05-08 ENCOUNTER — Telehealth: Admit: 2024-05-08 | Payer: PRIVATE HEALTH INSURANCE | Attending: Otolaryngology

## 2024-05-08 NOTE — Telephone Encounter
 Copied from CRM #[id number]. Topic: General Message - [facility]>> May 08, 2024 12:05 PM Brendia Callander wrote:[facility]ENTER MESSAGETime of call:   12:05 PMCaller:   laurieCaller's relationship to patient:  self  Calling from (pharmacy, hospital, agency, etc.):  n/a   Specialist you are calling for:  Dr. Kveton Reason for call:   Patient was seen on Friday was given medication and daughter would like to discuss how the patient has been acting since he's been on the medication.  She states her dad has dementia and he's been getting worst since friday and she is wondering if that has to do with the medication. No medication was given today  until she hears back from Dr. Kveton.  Best telephone number for callback:   [phone] [facility] Scheduler

## 2024-05-09 NOTE — Telephone Encounter
 Copied from CRM #[id number]. Topic: [facility] General CRM - General Inquiry>> May 09, 2024  9:15 AM Adam Holm wrote:Sansone,Laurie/daughter called regarding returning missed call she received. Please advise. Christiane Cowing can be reached at [phone]

## 2024-05-09 NOTE — Telephone Encounter
 Placed call to pt. Spoke with pts daughter. Pts daughter reports pt has dementia and since started taking medication prescribed on visit she has notice pts dementia symptoms has been more noticeable and is wondering if it could be the medication. Pts daughter stated he is doing much better today. Please advise.

## 2024-05-11 ENCOUNTER — Telehealth: Admit: 2024-05-11 | Payer: PRIVATE HEALTH INSURANCE | Attending: Otolaryngology

## 2024-05-11 LAB — EAR CULTURE     (BH GH LMW YH)

## 2024-05-11 NOTE — Telephone Encounter
 Copied from CRM #[id number]. Topic: General Inquiry - General Inquiry>> May 11, 2024  9:46 AM Alferd Igo wrote:Laurie - daughter called regarding looking to discuss the lab results that Dr. Kveton ordered. She can be reached at [phone]. Thank you!

## 2024-05-15 ENCOUNTER — Encounter: Admit: 2024-05-15 | Payer: PRIVATE HEALTH INSURANCE

## 2024-05-15 ENCOUNTER — Encounter: Admit: 2024-05-15 | Payer: PRIVATE HEALTH INSURANCE | Attending: Otolaryngology

## 2024-05-15 MED ORDER — FLUCONAZOLE 100 MG TABLET
100 | ORAL_TABLET | Freq: Every day | ORAL | 1 refills | 30.00000 days | Status: AC
Start: 2024-05-15 — End: ?

## 2024-05-15 NOTE — Telephone Encounter
 Copied from CRM #[id number]. Topic: General Inquiry - General Inquiry>> May 15, 2024  9:31 AM Cameron Zimmerman wrote:Cameron Zimmerman - daughter called regarding scheduling a follow up for her dads chronic ear infection/pain. Patient was seen by Dr. Kveton on 6/6 but is still having issues. Cameron Zimmerman also states he had a swab done that day but doesn't understand the results in MyChart and she just wants to make sure that he is on the correct antibiotic. First available is 11/21 but they would like to be seen sooner if possible. Cameron Zimmerman can be reached at [phone]. Please advise, thank you!

## 2024-05-15 NOTE — Telephone Encounter
 Placed call to pt. RX sent.

## 2024-05-15 NOTE — Telephone Encounter
-----   Message from Floyce Hutching, MD sent at 05/15/2024 11:39 AM EDT -----Regarding: YN:WGNFAO send out diflucan. Thanks----- Message -----From: Lab, Background UserSent: 05/05/2024   9:25 PM EDTTo: John F. Kveton, MD

## 2024-05-16 ENCOUNTER — Encounter: Admit: 2024-05-16 | Payer: PRIVATE HEALTH INSURANCE

## 2024-05-17 ENCOUNTER — Telehealth: Admit: 2024-05-17 | Payer: PRIVATE HEALTH INSURANCE

## 2024-05-17 NOTE — Telephone Encounter
 Pts daughter accepted- and scheduled

## 2024-05-17 NOTE — Telephone Encounter
 New patient intake completed with daughter Jacki Cones

## 2024-05-22 ENCOUNTER — Telehealth: Admit: 2024-05-22 | Payer: PRIVATE HEALTH INSURANCE

## 2024-05-22 ENCOUNTER — Telehealth: Admit: 2024-05-22 | Payer: PRIVATE HEALTH INSURANCE | Attending: Internal Medicine

## 2024-05-22 ENCOUNTER — Encounter: Admit: 2024-05-22 | Payer: PRIVATE HEALTH INSURANCE | Attending: Internal Medicine

## 2024-05-22 ENCOUNTER — Ambulatory Visit: Admit: 2024-05-22 | Payer: PRIVATE HEALTH INSURANCE | Attending: Internal Medicine

## 2024-05-22 VITALS — BP 143/89 | HR 69 | Wt 142.0 lb

## 2024-05-22 DIAGNOSIS — R413 Other amnesia: Secondary | ICD-10-CM

## 2024-05-22 LAB — VITAMIN B12: BKR VITAMIN B12: 341 pg/mL (ref 232–1245)

## 2024-05-22 LAB — TSH W/REFLEX TO FT4     (BH GH LMW Q YH): BKR THYROID STIMULATING HORMONE: 16.7 u[IU]/mL — ABNORMAL HIGH

## 2024-05-22 LAB — T4, FREE: BKR FREE T4: 0.92 ng/dL

## 2024-05-22 MED ORDER — NEOMYCIN-POLYMYXIN-HYDROCORT 3.5 MG/ML-10,000 UNIT/ML-1 % EAR SOLUTION
3.5-10000-1 | 1.00 refills | Status: AC
Start: 2024-05-22 — End: ?
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
## 2024-05-22 NOTE — Telephone Encounter
 I spoke with Cameron Zimmerman. High TSH >10 with T4 on the lower end of normal c/w subclinical hypothyroidism.  A substantial proportion of patients with subclinical hypothyroidism eventually develop overt hypothyroidism.  Explained that he may be having symptoms of this already.Normally, a primary care provider would take this over.  But he currently does not have a PCP.  Our plan is as follows:  Daughter is going to try finding a PCP either in [place] or taking him to [facility] which has walk in hours.  I am hoping he can establish care. If there are no leads on getting a PCP visit soon, am happy to start him on  50 mcg daily.  Although he should have have a PCP in place to follow up repeat labs in 6 weeks

## 2024-05-22 NOTE — Telephone Encounter
 Daughter Mitzie called looking for a phone appointment to discuss additional questions they had from today requiring resources and additional information about what they can do or prepare for caring for the patient moving forward.  I clarified that she has access to her dads MyChart and she does and I told her to summarize their questions and send it via MyChart and depending I would either call her back or message her back with the information they are looking for and we can start there.  She was in agreement and verbalized understanding and said she will do so.

## 2024-05-22 NOTE — Patient Instructions
 We have ordered an MRI of the brain.  Please call to schedule at [phone].  Call our office to let us  know that you have completed the imaging and the provider will review.

## 2024-05-23 ENCOUNTER — Encounter: Admit: 2024-05-23 | Payer: PRIVATE HEALTH INSURANCE | Attending: Internal Medicine

## 2024-05-24 ENCOUNTER — Ambulatory Visit: Admit: 2024-05-24 | Payer: PRIVATE HEALTH INSURANCE | Attending: Otolaryngology

## 2024-05-24 ENCOUNTER — Encounter: Admit: 2024-05-24 | Payer: PRIVATE HEALTH INSURANCE

## 2024-05-24 ENCOUNTER — Ambulatory Visit: Admit: 2024-05-24 | Payer: Medicare (Managed Care)

## 2024-05-24 DIAGNOSIS — H6192 Disorder of left external ear, unspecified: Secondary | ICD-10-CM

## 2024-05-24 DIAGNOSIS — Z0000 Encounter for general adult medical examination without abnormal findings: Secondary | ICD-10-CM

## 2024-05-24 NOTE — Progress Notes
 [facility] OF MEDICINE						Department of SurgeryDivision of Otolaryngology - Head and Neck SurgeryPatient Name: Cameron Zimmerman of Birth: Dec 28, 1933 Date of Visit: 6/25/2025History of Present Illness:  Cameron Zimmerman is a 89 y.o. male who is seen for lelft ear infection growing candidaPhysical Examination:  Vital Signs: 	There were no vitals taken for this visit. Current Outpatient Medications on File Prior to Visit Medication Sig  cholecalciferol , vitamin D3, 25 mcg (1,000 unit) tablet Take 4 tablets (4,000 Units total) by mouth once a week.  clopidogreL  (PLAVIX ) 75 mg tablet Take 1 tablet (75 mg total) by mouth daily.  fluconazole  (DIFLUCAN ) 100 mg tablet Take 1 tablet (100 mg total) by mouth daily for 10 days.  losartan  (COZAAR ) 100 MG tablet Take 1 tablet (100 mg total) by mouth daily.. (Patient taking differently: Take 0.5 tablets (50 mg total) by mouth daily.)  metoprolol  succinate XL (TOPROL -XL) 25 mg 24 hr tablet Take 1 tablet (25 mg total) by mouth daily. Take with or immediately following a meal.  neomycin -polymyxin-hydrocortisone  (CORTISPORIN) otic solution PUT 4 DROPS IN AFFECTED EARS 3 TIMES A DAY  nitroGLYCERIN (NITROSTAT) 0.4 mg SL tablet 1 tab(s) sublingually (Patient not taking: Reported on 05/22/2024)  traMADoL  (ULTRAM ) 50 mg tablet Take 1 tablet (50 mg total) by mouth every 6 (six) hours as needed for pain (for severe pain). No current facility-administered medications on file prior to visit.  Allergies[1] Otolaryngic Examination:General: 	Healthy appearing; alert and oriented.Head:		Normal cephalic Sinus:		Non tender, no purulenceSalivary glands: No massesFacial nerve:	Symmetrical Right Ear: 	Tympanic membrane: Normal; external auditory canal: Normal; auricle: Normal.Left Ear: 	Tympanic membrane: Normal; external auditory canal: swollen red mass infrior canal skinauricle: Normal.Assessment:  Cameron Zimmerman 89 y.o. male with left er canal mass.  Plan:  Recommend biopsy ear canal lesionCc:Johnson, Francis NOVAK, MD Michal Strzelecki F. Pratham Cassatt, MD, FACSYale Otolaryngology - Head and Neck SurgerySection Chief, Otology, Neurotology and [facility] Surgery  [1] AllergiesAllergen Reactions  Statins-Hmg-Coa Reductase Inhibitors Other (See Comments)   Product containing 3-hydroxy-3-methylglutaryl-coenzyme A reductase inhibitor (product)

## 2024-06-01 LAB — FUNGAL CULTURE

## 2024-06-09 ENCOUNTER — Encounter: Admit: 2024-06-09 | Payer: PRIVATE HEALTH INSURANCE | Attending: Otolaryngology

## 2024-06-09 ENCOUNTER — Ambulatory Visit: Admit: 2024-06-09 | Payer: PRIVATE HEALTH INSURANCE | Attending: Otolaryngology

## 2024-06-09 VITALS — BP 105/72 | HR 68 | Temp 68.00000°F | Wt 148.0 lb

## 2024-06-09 DIAGNOSIS — Z9889 Other specified postprocedural states: Secondary | ICD-10-CM

## 2024-06-09 DIAGNOSIS — I10 Essential (primary) hypertension: Secondary | ICD-10-CM

## 2024-06-09 DIAGNOSIS — N189 Chronic kidney disease, unspecified: Secondary | ICD-10-CM

## 2024-06-09 DIAGNOSIS — I2510 Atherosclerotic heart disease of native coronary artery without angina pectoris: Principal | ICD-10-CM

## 2024-06-09 DIAGNOSIS — E785 Hyperlipidemia, unspecified: Secondary | ICD-10-CM

## 2024-06-09 DIAGNOSIS — K5790 Diverticulosis of intestine, part unspecified, without perforation or abscess without bleeding: Secondary | ICD-10-CM

## 2024-06-09 DIAGNOSIS — H60392 Other infective otitis externa, left ear: Principal | ICD-10-CM

## 2024-06-09 DIAGNOSIS — Z8719 Personal history of other diseases of the digestive system: Secondary | ICD-10-CM

## 2024-06-09 DIAGNOSIS — K449 Diaphragmatic hernia without obstruction or gangrene: Secondary | ICD-10-CM

## 2024-06-09 DIAGNOSIS — G459 Transient cerebral ischemic attack, unspecified: Secondary | ICD-10-CM

## 2024-06-09 DIAGNOSIS — M5430 Sciatica, unspecified side: Secondary | ICD-10-CM

## 2024-06-09 NOTE — Progress Notes
 The following is the transcribed Review of Systems completed by the patient at intake.Review of Systems Neurological:  Positive for dizziness and headaches.      Vertigo All other systems reviewed and are negative.

## 2024-06-09 NOTE — Progress Notes
 [facility] OF MEDICINE						Department of SurgeryDivision of Otolaryngology - Head and Neck SurgeryPatient Name: Cameron Zimmerman of Birth: 22-Sep-1934 Date of Visit: 7/11/25History of Present Illness:  Cameron Zimmerman is a 89 y.o. male who is seen for left chronic otitis externa.  Recent biopsy revealed granulation tissue  Less ear pain  No dischargePhysical Examination:  Vital Signs: 	There were no vitals taken for this visit. Current Outpatient Medications on File Prior to Visit Medication Sig  cholecalciferol , vitamin D3, 25 mcg (1,000 unit) tablet Take 4 tablets (4,000 Units total) by mouth once a week.  clopidogreL  (PLAVIX ) 75 mg tablet Take 1 tablet (75 mg total) by mouth daily.  losartan  (COZAAR ) 100 MG tablet Take 1 tablet (100 mg total) by mouth daily.. (Patient taking differently: Take 0.5 tablets (50 mg total) by mouth daily.)  metoprolol  succinate XL (TOPROL -XL) 25 mg 24 hr tablet Take 1 tablet (25 mg total) by mouth daily. Take with or immediately following a meal.  neomycin -polymyxin-hydrocortisone  (CORTISPORIN) otic solution PUT 4 DROPS IN AFFECTED EARS 3 TIMES A DAY  nitroGLYCERIN (NITROSTAT) 0.4 mg SL tablet 1 tab(s) sublingually (Patient not taking: Reported on 05/24/2024)  traMADoL  (ULTRAM ) 50 mg tablet Take 1 tablet (50 mg total) by mouth every 6 (six) hours as needed for pain (for severe pain). No current facility-administered medications on file prior to visit.  Allergies[1] Otolaryngic Examination:General: 	Healthy appearing; alert and oriented.Head:		Normal cephalic Sinus:		Non tender, no purulenceSalivary glands: No massesFacial nerve:	Symmetrical Right Ear: 	Tympanic membrane: Normal; external auditory canal: Normal; auricle: Normal.Left Ear: 	Tympanic membrane: Normal; external auditory canal: less swelling auricle: Normal.Assessment:  Cameron Zimmerman is a 89 y.o. male with left chronic OE resolvingPlan:  Use crops BID for one monthCc:Johnson, Francis NOVAK, MD Sorah Falkenstein F. Aishwarya Shiplett, MD, FACSYale Otolaryngology - Head and Neck SurgerySection Chief, Otology, Neurotology and Skull Base Surgery  [1] AllergiesAllergen Reactions  Statins-Hmg-Coa Reductase Inhibitors Other (See Comments)   Product containing 3-hydroxy-3-methylglutaryl-coenzyme A reductase inhibitor (product)

## 2024-06-15 ENCOUNTER — Inpatient Hospital Stay: Admit: 2024-06-15 | Discharge: 2024-06-15 | Payer: Medicare (Managed Care)

## 2024-06-15 ENCOUNTER — Ambulatory Visit: Admit: 2024-06-15 | Payer: Medicare (Managed Care) | Attending: Internal Medicine

## 2024-06-15 ENCOUNTER — Encounter: Admit: 2024-06-15 | Payer: PRIVATE HEALTH INSURANCE | Attending: Internal Medicine

## 2024-06-15 DIAGNOSIS — E039 Hypothyroidism, unspecified: Secondary | ICD-10-CM

## 2024-06-15 DIAGNOSIS — I519 Heart disease, unspecified: Secondary | ICD-10-CM

## 2024-06-15 DIAGNOSIS — R300 Dysuria: Principal | ICD-10-CM

## 2024-06-15 LAB — CBC WITH AUTO DIFFERENTIAL
BKR WAM ABSOLUTE IMMATURE GRANULOCYTES.: 0.04 x 1000/ÂµL (ref 0.00–0.30)
BKR WAM ABSOLUTE LYMPHOCYTE COUNT.: 1.61 x 1000/ÂµL (ref 0.60–3.70)
BKR WAM ABSOLUTE NRBC: 0 x 1000/ÂµL (ref 0.00–1.00)
BKR WAM ANC (ABSOLUTE NEUTROPHIL COUNT): 5.29 x 1000/ÂµL (ref 2.00–7.60)
BKR WAM BASOPHIL ABSOLUTE COUNT.: 0.06 x 1000/ÂµL (ref 0.00–1.00)
BKR WAM BASOPHILS: 0.8 % (ref 0.0–1.4)
BKR WAM EOSINOPHIL ABSOLUTE COUNT.: 0.13 x 1000/ÂµL (ref 0.00–1.00)
BKR WAM EOSINOPHILS: 1.6 % (ref 0.0–5.0)
BKR WAM HEMATOCRIT: 42.6 % (ref 38.50–50.00)
BKR WAM HEMOGLOBIN: 13.7 g/dL (ref 13.2–17.1)
BKR WAM IMMATURE GRANULOCYTES: 0.5 % (ref 0.0–1.0)
BKR WAM LYMPHOCYTES: 20.3 % (ref 17.0–50.0)
BKR WAM MCH: 32.3 pg (ref 27.0–33.0)
BKR WAM MCHC: 32.2 g/dL (ref 31.0–36.0)
BKR WAM MCV: 100.5 fL — ABNORMAL HIGH (ref 80.0–100.0)
BKR WAM MONOCYTE ABSOLUTE COUNT.: 0.79 x 1000/ÂµL (ref 0.00–1.00)
BKR WAM MONOCYTES: 10 % (ref 4.0–12.0)
BKR WAM MPV: 10.3 fL (ref 8.0–12.0)
BKR WAM NEUTROPHILS: 66.8 % (ref 39.0–72.0)
BKR WAM NUCLEATED RED BLOOD CELLS: 0 % (ref 0.0–1.0)
BKR WAM PLATELETS: 297 x1000/ÂµL (ref 150–420)
BKR WAM RDW-CV: 13.2 % (ref 11.0–15.0)
BKR WAM RED BLOOD CELL COUNT.: 4.24 M/ÂµL (ref 4.00–6.00)
BKR WAM WHITE BLOOD CELL COUNT: 7.9 x1000/ÂµL (ref 4.0–11.0)

## 2024-06-15 LAB — URINALYSIS-MACROSCOPIC W/REFLEX MICROSCOPIC
BKR BLOOD, UA: NEGATIVE
BKR LEUKOCYTE ESTERASE, UA: NEGATIVE
BKR NITRITE, UA: NEGATIVE
BKR PH, UA: 5.5 (ref 5.5–7.5)
BKR SPECIFIC GRAVITY, UA: 1.026 (ref 1.005–1.030)
BKR UROBILINOGEN, UA: 2 mg/dL (ref <=2.0)

## 2024-06-15 LAB — TSH W/REFLEX TO FT4     (BH GH LMW Q YH): BKR THYROID STIMULATING HORMONE: 0.879 u[IU]/mL

## 2024-06-15 LAB — URINE MICROSCOPIC     (BH GH LMW YH)
BKR HYALINE CASTS, UA (INSTRUMENT): 62 /LPF — ABNORMAL HIGH (ref 0–3)
BKR RBC/HPF, UA (INSTRUMENT): <1 /HPF (ref 0–2)
BKR URINE SQUAMOUS EPITHELIAL CELLS, UA (INSTRUMENT): <1 /HPF (ref 0–5)
BKR WBC/HPF, UA (INSTRUMENT): 5 /HPF (ref 0–5)

## 2024-06-15 LAB — T3: BKR T3 TOTAL: 72.2 ng/dL

## 2024-06-16 LAB — LYME ANTIBODIES W/RFLX TO CONFIRM (MODIFIED TWO-TIER TESTING)
BKR LYME TOTAL ANTIBODY INTERPRETATION: NEGATIVE
BKR LYME TOTAL ANTIBODY LI: 0.14 LI (ref <=0.90)

## 2024-06-16 LAB — URINE CULTURE: BKR URINE CULTURE, ROUTINE: NO GROWTH

## 2024-08-21 ENCOUNTER — Ambulatory Visit: Admit: 2024-08-21 | Payer: PRIVATE HEALTH INSURANCE | Attending: Internal Medicine

## 2024-09-04 ENCOUNTER — Encounter: Admit: 2024-09-04 | Payer: PRIVATE HEALTH INSURANCE | Attending: Internal Medicine

## 2024-09-04 DIAGNOSIS — M545 Low back pain, non-specific: Principal | ICD-10-CM

## 2024-09-05 ENCOUNTER — Encounter: Admit: 2024-09-05 | Payer: PRIVATE HEALTH INSURANCE | Attending: Internal Medicine

## 2024-09-05 DIAGNOSIS — M545 Low back pain radiating to both legs: Principal | ICD-10-CM

## 2024-09-07 ENCOUNTER — Inpatient Hospital Stay: Admit: 2024-09-07 | Discharge: 2024-09-07 | Payer: Medicare (Managed Care)

## 2024-09-07 DIAGNOSIS — M545 Low back pain, unspecified: Secondary | ICD-10-CM

## 2024-10-02 ENCOUNTER — Ambulatory Visit: Admit: 2024-10-02 | Payer: PRIVATE HEALTH INSURANCE | Attending: Internal Medicine | Primary: Internal Medicine

## 2024-10-02 VITALS — BP 114/77 | HR 73 | Resp 18 | Wt 137.7 lb

## 2024-10-02 DIAGNOSIS — F03A Mild dementia, unspecified dementia type, unspecified whether behavioral, psychotic, or mood disturbance or anxiety (HC Code) (HC CODE): Secondary | ICD-10-CM

## 2024-10-02 DIAGNOSIS — Z23 Encounter for immunization: Principal | ICD-10-CM

## 2024-10-02 MED ORDER — LEVOTHYROXINE 75 MCG TABLET
75 | ORAL | 3.00 refills | 30.00000 days | Status: AC
Start: 2024-10-02 — End: ?

## 2024-10-02 NOTE — Patient Instructions [37]
 The first step in investigating the cause of memory loss is a brain MRI.In order to have a biomarker confirmed diagnosis of Alzheimer's disease, you need to have a lumbar puncture or PET scan to look for evidence of Alzheimer's-related changes to the brain. Many patients choose not to get a lumbar puncture since it can be uncomfortable, but it is generally a very well tolerated and routine outpatient procedure. It is really up to the patient how certain they want to be with their diagnosis of Alzheimer's disease. For many patients, the certainty of the diagnosis does not change how we manage this disease.  There are newer drugs on the market, Lecanemab (Leqembi) and Donanemab [employer]) that we can only give to patients who have a lumbar puncture or PET scan result that is consistent with Alzheimer's disease. [facility] and many other institutions are currently working to determine the best protocol for screening, counseling, and monitoring patients who are interested in receiving the treatment. Please note, not all patients with Alzheimer's disease will be eligible for these drugs.  Lecanemab has been proven to have a modest clinical benefit, slowing disease progression by approximately five to eight months over an eighteen-month period in the data that is available to date. However, the drug is also associated with risks and considerations, which should be discussed with your Lecanemab prescriber. The drug is administered as an infusion every two weeks and will also require monitoring for side effects using repeated MRI brain scans.  After 18 months of treatment, you may start maintenance infusions once monthly (once every 4 weeks). You can read more about these drugs at the following (this is written by [employer] but still informative):https://[facility].org/campaigns/lecanemab.htmlOfficial Lecanemab [employer] Donanemab Website:https://kisunla.lilly.com/

## 2024-10-19 ENCOUNTER — Encounter: Admit: 2024-10-19 | Payer: PRIVATE HEALTH INSURANCE | Primary: Internal Medicine

## 2024-11-07 ENCOUNTER — Inpatient Hospital Stay: Admit: 2024-11-07 | Discharge: 2024-11-07 | Payer: Medicare (Managed Care)

## 2024-11-07 ENCOUNTER — Encounter: Admit: 2024-11-07 | Payer: PRIVATE HEALTH INSURANCE | Primary: Internal Medicine

## 2024-11-07 ENCOUNTER — Emergency Department: Admit: 2024-11-07 | Payer: Medicare (Managed Care) | Primary: Internal Medicine

## 2024-11-07 DIAGNOSIS — L989 Disorder of the skin and subcutaneous tissue, unspecified: Secondary | ICD-10-CM

## 2024-11-07 DIAGNOSIS — G459 Transient cerebral ischemic attack, unspecified: Secondary | ICD-10-CM

## 2024-11-07 DIAGNOSIS — I2510 Atherosclerotic heart disease of native coronary artery without angina pectoris: Principal | ICD-10-CM

## 2024-11-07 DIAGNOSIS — K5790 Diverticulosis of intestine, part unspecified, without perforation or abscess without bleeding: Secondary | ICD-10-CM

## 2024-11-07 DIAGNOSIS — Z9889 Other specified postprocedural states: Secondary | ICD-10-CM

## 2024-11-07 DIAGNOSIS — Z888 Allergy status to other drugs, medicaments and biological substances status: Secondary | ICD-10-CM

## 2024-11-07 DIAGNOSIS — I10 Essential (primary) hypertension: Secondary | ICD-10-CM

## 2024-11-07 DIAGNOSIS — S80812A Abrasion, left lower leg, initial encounter: Secondary | ICD-10-CM

## 2024-11-07 DIAGNOSIS — N189 Chronic kidney disease, unspecified: Secondary | ICD-10-CM

## 2024-11-07 DIAGNOSIS — E785 Hyperlipidemia, unspecified: Secondary | ICD-10-CM

## 2024-11-07 DIAGNOSIS — K449 Diaphragmatic hernia without obstruction or gangrene: Secondary | ICD-10-CM

## 2024-11-07 DIAGNOSIS — M5430 Sciatica, unspecified side: Secondary | ICD-10-CM

## 2024-11-07 DIAGNOSIS — M25511 Pain in right shoulder: Secondary | ICD-10-CM

## 2024-11-07 DIAGNOSIS — Z8719 Personal history of other diseases of the digestive system: Secondary | ICD-10-CM

## 2024-11-07 MED ORDER — BACITRACIN ZINC 500 UNIT/GRAM TOPICAL OINTMENT
500 | Freq: Two times a day (BID) | TOPICAL | 1 refills | 5.50000 days | Status: AC
Start: 2024-11-07 — End: ?

## 2024-11-07 MED ORDER — ACETAMINOPHEN 500 MG TABLET
500 | ORAL_TABLET | Freq: Four times a day (QID) | ORAL | 1 refills | 4.00000 days | Status: AC | PRN
Start: 2024-11-07 — End: ?

## 2024-11-07 MED ORDER — PREDNISONE 50 MG TABLET
50 | ORAL_TABLET | Freq: Every day | ORAL | 1 refills | 10.00000 days | Status: AC
Start: 2024-11-07 — End: ?

## 2024-11-07 NOTE — ED Notes [6]
 Provider in to see and examine, xray completed, d/c instructions by provider

## 2024-11-08 NOTE — ED Provider Notes [19]
 Chief Complaint Patient presents with  Shoulder Pain   Right shoulder pain for the past couple of weeks, no known injury   Sore   Also has a bare patch of skin on his lower right leg, itchy at night, had a skin graph placed to that area about 3 yrs ago  HPI/PE:89 year old patient presenting with right shoulder pain x 2 weeks. Patient denies any falls, injury or trauma to the right shoulder. He denies any heavy lifting. Patient reports pain with movement. Patient also reporting soreness to his skin graft on his left lower leg. Patient states at night he scratches the graft with his right foot and the skin is so thin it has become raw. She reports a scratch that is sensitive to touch   Physical ExamED Triage Vitals [11/07/24 1639]BP: (!) 174/95Pulse: 69Pulse from  O2 sat: n/aResp: 16Temp: 97.9 ?F (36.6 ?C)Temp src: TemporalSpO2: 99 % BP (!) 174/95  - Pulse 69  - Temp 97.9 ?F (36.6 ?C) (Temporal)  - Resp 16  - SpO2 99% Physical ExamVitals and nursing note reviewed. Constitutional:     Appearance: Normal appearance. Cardiovascular:    Rate and Rhythm: Normal rate and regular rhythm. Pulmonary:    Effort: Pulmonary effort is normal.    Breath sounds: Normal breath sounds. Musculoskeletal:    Right shoulder: Tenderness (at marked area) present. No swelling, bony tenderness or crepitus. Normal strength. Normal pulse.      Arms:Lymphadenopathy:    Cervical: No cervical adenopathy. Skin:   General: Skin is warm.    Capillary Refill: Capillary refill takes less than 2 seconds.    Findings: Abrasion (superficial abrasion with surrounding erythema noted to left lower leg at site of skin graft) present.    Neurological:    General: No focal deficit present.    Mental Status: He is alert and oriented to person, place, and time.  ProceduresAttestation/Critical CareClinical Impressions as of 11/08/24 1812 Shoulder pain, unspecified chronicity, unspecified laterality  XR Shoulder Right AP Internal AP External [facility]) Final Result  No acute osseous abnormality.  [facility] Radiology Notify System Classification: Routine.  Report initiated by:  Malka Patch, MD  Reported and signed by: Sammi Star, MD    [facility] Radiology and Biomedical Imaging   ED DispositionDischarge Patient was educated on nature and course of presenting illness. Patient demonstrates and verbalizes understanding of plan of care. Patient was instructed to follow up if symptoms persist or do not improve. Patient advised the following in regards to discharge instructions:Please apply bacitracin  to the area of the skin graft, please keep it covered especially at night. Please apply heat and ice to the area of pain in your shoulder. Please begin taking prednisone  and tylenol   Oumar Marcott Iraida, APRN12/10/25 1812

## 2024-11-08 NOTE — Discharge Instructions [18]
 Please apply bacitracin  to the area of the skin graft, please keep it covered especially at night. Please apply heat and ice to the area of pain in your shoulder. Please begin taking prednisone  and tylenol 

## 2024-11-14 ENCOUNTER — Telehealth: Admit: 2024-11-14 | Payer: PRIVATE HEALTH INSURANCE | Attending: Internal Medicine | Primary: Internal Medicine

## 2024-11-14 NOTE — Telephone Encounter [36]
 Returning patient's call.  Patient says that he just started taking medication which was prescribed 11/07/2024 by Dena Castor APRN.  He says he  still shoulder pain.  Advised him to follow-up with his primary doctor and orthopedist.  Patient expressed understanding and said he will.

## 2025-05-02 ENCOUNTER — Ambulatory Visit: Admit: 2025-05-02 | Payer: PRIVATE HEALTH INSURANCE | Attending: Internal Medicine | Primary: Internal Medicine
